# Patient Record
Sex: Female | Born: 1950 | Race: Black or African American | Hispanic: No | Smoking: Never smoker
Health system: Southern US, Community
[De-identification: ages and names within clinical notes are randomized; demographics above are authoritative.]

## PROBLEM LIST (undated history)

## (undated) DIAGNOSIS — Z800 Family history of malignant neoplasm of digestive organs: Secondary | ICD-10-CM

## (undated) DIAGNOSIS — I10 Essential (primary) hypertension: Secondary | ICD-10-CM

## (undated) DIAGNOSIS — T7840XA Allergy, unspecified, initial encounter: Secondary | ICD-10-CM

## (undated) DIAGNOSIS — N189 Chronic kidney disease, unspecified: Secondary | ICD-10-CM

## (undated) DIAGNOSIS — M8580 Other specified disorders of bone density and structure, unspecified site: Secondary | ICD-10-CM

## (undated) DIAGNOSIS — B019 Varicella without complication: Secondary | ICD-10-CM

## (undated) DIAGNOSIS — K279 Peptic ulcer, site unspecified, unspecified as acute or chronic, without hemorrhage or perforation: Secondary | ICD-10-CM

## (undated) DIAGNOSIS — K219 Gastro-esophageal reflux disease without esophagitis: Secondary | ICD-10-CM

## (undated) DIAGNOSIS — M1990 Unspecified osteoarthritis, unspecified site: Secondary | ICD-10-CM

## (undated) DIAGNOSIS — E669 Obesity, unspecified: Secondary | ICD-10-CM

## (undated) DIAGNOSIS — D649 Anemia, unspecified: Secondary | ICD-10-CM

## (undated) DIAGNOSIS — Z87440 Personal history of urinary (tract) infections: Secondary | ICD-10-CM

## (undated) DIAGNOSIS — J45909 Unspecified asthma, uncomplicated: Secondary | ICD-10-CM

## (undated) HISTORY — DX: Other specified disorders of bone density and structure, unspecified site: M85.80

## (undated) HISTORY — DX: Chronic kidney disease, unspecified: N18.9

## (undated) HISTORY — DX: Gastro-esophageal reflux disease without esophagitis: K21.9

## (undated) HISTORY — PX: BREAST BIOPSY: SHX20

## (undated) HISTORY — DX: Unspecified osteoarthritis, unspecified site: M19.90

## (undated) HISTORY — DX: Allergy, unspecified, initial encounter: T78.40XA

## (undated) HISTORY — DX: Essential (primary) hypertension: I10

## (undated) HISTORY — DX: Personal history of urinary (tract) infections: Z87.440

## (undated) HISTORY — DX: Obesity, unspecified: E66.9

## (undated) HISTORY — PX: TONSILLECTOMY: SUR1361

## (undated) HISTORY — DX: Peptic ulcer, site unspecified, unspecified as acute or chronic, without hemorrhage or perforation: K27.9

## (undated) HISTORY — DX: Varicella without complication: B01.9

## (undated) HISTORY — DX: Anemia, unspecified: D64.9

## (undated) HISTORY — DX: Family history of malignant neoplasm of digestive organs: Z80.0

## (undated) HISTORY — DX: Unspecified asthma, uncomplicated: J45.909

---

## 1994-07-18 HISTORY — PX: ABDOMINAL HYSTERECTOMY: SHX81

## 1998-01-12 ENCOUNTER — Ambulatory Visit (HOSPITAL_COMMUNITY): Admission: RE | Admit: 1998-01-12 | Discharge: 1998-01-12 | Payer: Self-pay | Admitting: Family Medicine

## 2000-09-22 ENCOUNTER — Encounter: Payer: Self-pay | Admitting: Family Medicine

## 2000-09-22 ENCOUNTER — Ambulatory Visit (HOSPITAL_COMMUNITY): Admission: RE | Admit: 2000-09-22 | Discharge: 2000-09-22 | Payer: Self-pay | Admitting: Family Medicine

## 2002-07-18 HISTORY — PX: COLONOSCOPY: SHX174

## 2006-12-13 ENCOUNTER — Ambulatory Visit: Payer: Self-pay | Admitting: Obstetrics and Gynecology

## 2012-07-18 HISTORY — PX: BREAST SURGERY: SHX581

## 2012-11-14 ENCOUNTER — Encounter: Payer: Self-pay | Admitting: Vascular Surgery

## 2012-11-14 ENCOUNTER — Other Ambulatory Visit: Payer: Self-pay | Admitting: *Deleted

## 2012-11-14 DIAGNOSIS — I739 Peripheral vascular disease, unspecified: Secondary | ICD-10-CM

## 2012-12-31 ENCOUNTER — Encounter: Payer: Self-pay | Admitting: Vascular Surgery

## 2013-01-01 ENCOUNTER — Encounter (INDEPENDENT_AMBULATORY_CARE_PROVIDER_SITE_OTHER): Payer: 59 | Admitting: *Deleted

## 2013-01-01 ENCOUNTER — Encounter: Payer: Self-pay | Admitting: Vascular Surgery

## 2013-01-01 ENCOUNTER — Ambulatory Visit (INDEPENDENT_AMBULATORY_CARE_PROVIDER_SITE_OTHER): Payer: 59 | Admitting: Vascular Surgery

## 2013-01-01 ENCOUNTER — Other Ambulatory Visit: Payer: Self-pay | Admitting: *Deleted

## 2013-01-01 VITALS — BP 166/94 | HR 75 | Resp 16 | Ht 63.0 in | Wt 240.0 lb

## 2013-01-01 DIAGNOSIS — I739 Peripheral vascular disease, unspecified: Secondary | ICD-10-CM

## 2013-01-01 DIAGNOSIS — L97209 Non-pressure chronic ulcer of unspecified calf with unspecified severity: Secondary | ICD-10-CM

## 2013-01-01 DIAGNOSIS — L819 Disorder of pigmentation, unspecified: Secondary | ICD-10-CM

## 2013-01-01 DIAGNOSIS — I83223 Varicose veins of left lower extremity with both ulcer of ankle and inflammation: Secondary | ICD-10-CM

## 2013-01-01 DIAGNOSIS — M79609 Pain in unspecified limb: Secondary | ICD-10-CM | POA: Insufficient documentation

## 2013-01-01 NOTE — Addendum Note (Signed)
Addended by: Adria Dill L on: 01/01/2013 04:32 PM   Modules accepted: Orders

## 2013-01-01 NOTE — Progress Notes (Signed)
[facility]   Patient name: Sonya Gilbert MRN: [id number] DOB: 1950-10-22 Sex: female   Referred by: Manson Passey  Reason for referral:  Chief Complaint  Patient presents with  . New Evaluation    C/O Left lower lateral leg, dicoloration and spreading, duration 6 mos.  Right medial leg sore, getting worse in the last year.     HISTORY OF PRESENT ILLNESS: Patient has today referred for concern regarding possible arterial insufficiency. She does have discomfort and discoloration in her left leg. 0 the lateral aspect of her ankle and has been progressive. She reports that this is causes itching sensations and also discomfort over this area. She does report some tightening in her calf with walking but this is minimal. 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 She has no history of tissue loss or arterial rest pain. She does have strong family history of extensive venous varicosities in what sounds like severe venous hypertension in her mother with bleeding from the distal varicosities at some point in her life.  Past Medical History  Diagnosis Date  . Asthma   . Arthritis   . Hypertension   . Obesity   . Anemia     Past Surgical History  Procedure Laterality Date  . Abdominal hysterectomy  1996  . Tonsillectomy      At 62 yrs old    History   Social History  . Marital Status: Single    Spouse Name: N/A    Number of Children: N/A  . Years of Education: N/A   Occupational History  . Not on file.   Social History Main Topics  . Smoking status: Never Smoker   . Smokeless tobacco: Never Used  . Alcohol Use: 0.6 oz/week    1 Glasses of wine per week  . Drug Use: No  . Sexually Active: Not on file   Other Topics Concern  . Not on file   Social History Narrative  . No narrative on file    Family History  Problem Relation Age of Onset  . Cancer Sister    breast  . Hypertension Sister   . Deep vein thrombosis Sister   . Heart disease Mother     CHF  . Hypertension Mother   . Deep vein thrombosis Mother   . Cancer Father     Colon  . Hypertension Brother   . Cancer Sister     Colon    Allergies as of 01/01/2013 - Review Complete 01/01/2013  Allergen Reaction Noted  . Codeine Palpitations 11/14/2012  . Erythromycin Hives and Nausea And Vomiting 11/14/2012    Current Outpatient Prescriptions on File Prior to Visit  Medication Sig Dispense Refill  . albuterol (PROVENTIL HFA) 108 (90 BASE) MCG/ACT inhaler Inhale 2 puffs into the lungs every 6 (six) hours as needed for wheezing.      . Fluticasone-Salmeterol (ADVAIR) 250-50 MCG/DOSE AEPB Inhale 1 puff into the lungs every 12 (twelve) hours.      Marland Kitchen olmesartan-hydrochlorothiazide (BENICAR HCT) 20-12.5 MG per tablet Take 1 tablet by mouth daily.      . Vitamin D, Ergocalciferol, (DRISDOL) 50000 UNITS CAPS Take 50,000 Units by mouth 2 (two) times a week.       No current facility-administered medications on file prior to visit.     REVIEW OF SYSTEMS:  Positives indicated with an "X"  CARDIOVASCULAR:  [ ]  chest pain   [ ]   chest pressure   [ ]  palpitations   [ ]  orthopnea   [ ]  dyspnea on exertion   [x ] claudication   [ ]  rest pain   [ ]  DVT   [ ]  phlebitis PULMONARY:   [ ]  productive cough   [ ]  asthma   [ ]  wheezing NEUROLOGIC:   [ ]  weakness  [ ]  paresthesias  [ ]  aphasia  [ ]  amaurosis  [ ]  dizziness HEMATOLOGIC:   [ ]  bleeding problems   [ ]  clotting disorders MUSCULOSKELETAL:  [ ]  joint pain   [ ]  joint swelling GASTROINTESTINAL: [ ]   blood in stool  [ ]   hematemesis GENITOURINARY:  [ ]   dysuria  [ ]   hematuria PSYCHIATRIC:  [ ]  history of major depression INTEGUMENTARY:  [ ]  rashes  [ ]  ulcers CONSTITUTIONAL:  [ ]  fever   [ ]  chills  PHYSICAL EXAMINATION:  General: The patient is a well-nourished female, in no acute distress. Vital signs are BP 166/94  Pulse 75  Resp 16   Ht 5\' 3"  (1.6 m)  Wt 240 lb (108.863 kg)  BMI 42.52 kg/m2  SpO2 99% Pulmonary: There is a good air exchange bilaterally without wheezing or rales. Abdomen: Soft and non-tender with normal pitch bowel sounds. Musculoskeletal: There are no major deformities.  There is no significant extremity pain. Neurologic: No focal weakness or paresthesias are detected, Skin: Does have a thickening and hemosiderin deposit in the lateral aspect of her left distal leg above the level of her lateral malleolus. Psychiatric: The patient has normal affect. Cardiovascular: There is a regular rate and rhythm without significant murmur appreciated. Pulse status: 2+ dorsalis pedis pulses bilaterally   [facility] Vascular Lab Studies:  Ordered and Independently Reviewed arterial duplex reveals normal ankle arm index and normal triphasic waveforms bilaterally.  She did not have a formal duplex of her venous system. I did image these with SonoSite ultrasound and this does show prominent saphenous veins bilaterally. Unable to determine reflux with a screening study Impression and Plan:  No evidence of significant arterial insufficiency causing pain. This does appear to be more related to venous hypertension. I expanded she will need a venous duplex. We will have her return at her convenience in the next several weeks for a venous duplex and office visit to discuss this further. I not able to terminate she is a candidate currently for any specific treatment other than elevation and compression. We'll discuss this further when she has a venous reflux study.    Declin Rajan [facility] [place] Office: [phone]

## 2013-01-14 ENCOUNTER — Encounter: Payer: Self-pay | Admitting: Vascular Surgery

## 2013-01-15 ENCOUNTER — Encounter (INDEPENDENT_AMBULATORY_CARE_PROVIDER_SITE_OTHER): Payer: 59 | Admitting: *Deleted

## 2013-01-15 ENCOUNTER — Encounter: Payer: Self-pay | Admitting: Vascular Surgery

## 2013-01-15 ENCOUNTER — Ambulatory Visit (INDEPENDENT_AMBULATORY_CARE_PROVIDER_SITE_OTHER): Payer: 59 | Admitting: Vascular Surgery

## 2013-01-15 VITALS — BP 147/87 | HR 84 | Resp 18 | Ht 63.0 in | Wt 244.2 lb

## 2013-01-15 DIAGNOSIS — I739 Peripheral vascular disease, unspecified: Secondary | ICD-10-CM

## 2013-01-15 DIAGNOSIS — L819 Disorder of pigmentation, unspecified: Secondary | ICD-10-CM

## 2013-01-15 DIAGNOSIS — M79609 Pain in unspecified limb: Secondary | ICD-10-CM

## 2013-01-15 DIAGNOSIS — I83893 Varicose veins of bilateral lower extremities with other complications: Secondary | ICD-10-CM

## 2013-01-15 DIAGNOSIS — R609 Edema, unspecified: Secondary | ICD-10-CM

## 2013-01-15 NOTE — Progress Notes (Signed)
Patient presents today for continued evaluation of her left leg discomfort. I seen her several weeks ago with normal arterial study showing no significant future problems. She does have hemosiderin deposits had appearance of venous hypertension in the lateral left ankle. She does have discomfort with prolonged standing and more swelling in her left leg in her right leg.   Past Medical History  Diagnosis Date  . Asthma   . Arthritis   . Hypertension   . Obesity   . Anemia     History  Substance Use Topics  . Smoking status: Never Smoker   . Smokeless tobacco: Never Used  . Alcohol Use: 0.6 oz/week    1 Glasses of wine per week    Family History  Problem Relation Age of Onset  . Cancer Sister     breast  . Hypertension Sister   . Deep vein thrombosis Sister   . Heart disease Mother     CHF  . Hypertension Mother   . Deep vein thrombosis Mother   . Cancer Father     Colon  . Hypertension Brother   . Cancer Sister     Colon    Allergies  Allergen Reactions  . Codeine Palpitations  . Erythromycin Hives and Nausea And Vomiting    Current outpatient prescriptions:albuterol (PROVENTIL HFA) 108 (90 BASE) MCG/ACT inhaler, Inhale 2 puffs into the lungs every 6 (six) hours as needed for wheezing., Disp: , Rfl: ;  Fluticasone-Salmeterol (ADVAIR) 250-50 MCG/DOSE AEPB, Inhale 1 puff into the lungs every 12 (twelve) hours., Disp: , Rfl: ;  olmesartan-hydrochlorothiazide (BENICAR HCT) 20-12.5 MG per tablet, Take 1 tablet by mouth daily., Disp: , Rfl:  Vitamin D, Ergocalciferol, (DRISDOL) 50000 UNITS CAPS, Take 50,000 Units by mouth 2 (two) times a week., Disp: , Rfl:   BP 147/87  Pulse 84  Resp 18  Ht 5\' 3"  (1.6 m)  Wt 244 lb 3.2 oz (110.768 kg)  BMI 43.27 kg/m2  Body mass index is 43.27 kg/(m^2).       Physical exam: Well-developed black female no acute distress Palpable dorsalis pedis pulses bilaterally hemosiderin deposits over the lateral aspect of her left above  lateral malleolus ankle  Left leg venous duplex today reveals no evidence of deep venous reflux. She does have reflux in her saphenous vein through the midportion of her thigh with multiple branches outside the fascia emanating from this.  Impression and plan venous hypertension causing hemosiderin deposits at changes of venous stasis disease. The patient was fitted with thigh-high graduated compression garments today. We will see her again in 3 months for continued discussion. She may be a candidate for laser ablation should she have persistent difficulty despite conservative treatment

## 2013-04-29 ENCOUNTER — Encounter: Payer: Self-pay | Admitting: Vascular Surgery

## 2013-04-30 ENCOUNTER — Encounter (INDEPENDENT_AMBULATORY_CARE_PROVIDER_SITE_OTHER): Payer: Self-pay

## 2013-04-30 ENCOUNTER — Encounter: Payer: Self-pay | Admitting: Vascular Surgery

## 2013-04-30 ENCOUNTER — Ambulatory Visit (INDEPENDENT_AMBULATORY_CARE_PROVIDER_SITE_OTHER): Payer: 59 | Admitting: Vascular Surgery

## 2013-04-30 VITALS — BP 129/82 | HR 94 | Resp 18 | Ht 63.0 in | Wt 244.0 lb

## 2013-04-30 DIAGNOSIS — I83893 Varicose veins of bilateral lower extremities with other complications: Secondary | ICD-10-CM | POA: Insufficient documentation

## 2013-04-30 DIAGNOSIS — M79609 Pain in unspecified limb: Secondary | ICD-10-CM

## 2013-04-30 NOTE — Progress Notes (Signed)
Problems with Activities of Daily Living Secondary to Leg Pain  1. Sonya Gilbert states that exercise is very difficult for her due to leg pain.  2. Sonya Gilbert, Sonya Gilbert states that any activities that require prolonged standing (cooking, cleaning, shopping) are difficult for her due to leg pain.     Failure of  Conservative Therapy:  1. Worn 20-30 mm Hg thigh high compression hose >3 months with no relief of symptoms.  2. Frequently elevates legs-no relief of symptoms  3. Taken Ibuprofen 600 Mg TID with no relief of symptoms.  The patient presents today for further discussion of her lower trim of the discomfort. Her main complaint today is of bilateral knee discomfort. She reports this can occur with walking or prolonged standing. Just reports that she is sitting for several hours and then arises she has stiffness and soreness and to she begins to walk while in this becomes better. She does have persistent slight amount of swelling had no relief from the graduated compression garments.  Past Medical History  Diagnosis Date  . Asthma   . Arthritis   . Hypertension   . Obesity   . Anemia     History  Substance Use Topics  . Smoking status: Never Smoker   . Smokeless tobacco: Never Used  . Alcohol Use: 0.6 oz/week    1 Glasses of wine per week    Family History  Problem Relation Age of Onset  . Cancer Sister     breast  . Hypertension Sister   . Deep vein thrombosis Sister   . Heart disease Mother     CHF  . Hypertension Mother   . Deep vein thrombosis Mother   . Cancer Father     Colon  . Hypertension Brother   . Cancer Sister     Colon    Allergies  Allergen Reactions  . Codeine Palpitations  . Erythromycin Hives and Nausea And Vomiting    Current outpatient prescriptions:albuterol (PROVENTIL HFA) 108 (90 BASE) MCG/ACT inhaler, Inhale 2 puffs into the lungs every 6 (six) hours as needed for wheezing., Disp: , Rfl: ;  Fluticasone-Salmeterol (ADVAIR) 250-50 MCG/DOSE  AEPB, Inhale 1 puff into the lungs every 12 (twelve) hours., Disp: , Rfl: ;  olmesartan-hydrochlorothiazide (BENICAR HCT) 20-12.5 MG per tablet, Take 1 tablet by mouth daily., Disp: , Rfl:  Vitamin D, Ergocalciferol, (DRISDOL) 50000 UNITS CAPS, Take 50,000 Units by mouth 2 (two) times a week., Disp: , Rfl:   BP 129/82  Pulse 94  Resp 18  Ht 5\' 3"  (1.6 m)  Wt 244 lb (110.678 kg)  BMI 43.23 kg/m2  Body mass index is 43.23 kg/(m^2).       On physical exam well-developed moderately obese female no acute distress Palpable dorsalis pedis pulses bilaterally She does not have any evidence of varicosities. She does have some lower extremity swelling bilaterally  I reimaged her veins with SonoSite bilaterally. This does show stricture branches off of her saphenous vein with some enlargement of the saphenous vein. This was consistent with her formal left leg duplex in July.  Impression and plan superficial venous reflux. Her main complaints appear to be more arthritic. She is having evaluation of this and I certainly would reserve any recommendation for treatment since it seems that the majority of her symptoms are not related to venous hypertension. I discussed this with the patient and recommended that she see [place] should she have prior persistent trouble with swelling or aching with prolonged standing discrete from  the knee pain bilaterally. She will see [place] on an as-needed basis

## 2016-12-30 ENCOUNTER — Ambulatory Visit (INDEPENDENT_AMBULATORY_CARE_PROVIDER_SITE_OTHER): Payer: BLUE CROSS/BLUE SHIELD | Admitting: Family Medicine

## 2016-12-30 ENCOUNTER — Encounter: Payer: Self-pay | Admitting: Family Medicine

## 2016-12-30 VITALS — BP 130/80 | HR 90 | Resp 12 | Ht 63.0 in | Wt 265.4 lb

## 2016-12-30 DIAGNOSIS — E785 Hyperlipidemia, unspecified: Secondary | ICD-10-CM | POA: Insufficient documentation

## 2016-12-30 DIAGNOSIS — I83893 Varicose veins of bilateral lower extremities with other complications: Secondary | ICD-10-CM

## 2016-12-30 DIAGNOSIS — Z6841 Body Mass Index (BMI) 40.0 and over, adult: Secondary | ICD-10-CM | POA: Diagnosis not present

## 2016-12-30 DIAGNOSIS — I10 Essential (primary) hypertension: Secondary | ICD-10-CM | POA: Diagnosis not present

## 2016-12-30 DIAGNOSIS — E559 Vitamin D deficiency, unspecified: Secondary | ICD-10-CM | POA: Diagnosis not present

## 2016-12-30 MED ORDER — OLMESARTAN MEDOXOMIL-HCTZ 20-12.5 MG PO TABS: 1.0000 | ORAL_TABLET | Freq: Every day | ORAL | 2 refills | Status: DC

## 2016-12-30 NOTE — Patient Instructions (Signed)
A few things to remember from today's visit:   Hyperlipidemia, unspecified hyperlipidemia type - Plan: Lipid panel  Hypertension, essential, benign - Plan: Basic metabolic panel, olmesartan-hydrochlorothiazide (BENICAR HCT) 20-12.5 MG tablet  Vitamin D deficiency, unspecified - Plan: VITAMIN D 25 Hydroxy (Vit-D Deficiency, Fractures)  Blood pressure goal for most people is less than 140/90. Some populations (older than 60) the goal is less than 150/90.  Most recent cardiologists' recommendations recommend blood pressure at or less than 130/80.   Elevated blood pressure increases the risk of strokes, heart and kidney disease, and eye problems. Regular physical activity and a healthy diet (DASH diet) usually help. Low salt diet. Take medications as instructed.  Caution with some over the counter medications as cold medications, dietary products (for weight loss), and Ibuprofen or Aleve (frequent use);all these medications could cause elevation of blood pressure.    Please be sure medication list is accurate. If a new problem present, please set up appointment sooner than planned today.

## 2016-12-30 NOTE — Progress Notes (Signed)
HPI:   [name] is a 66 y.o. female, who is here today to establish care.  Former PCP: Sonya Gilbert Last preventive routine visit: 2017  Chronic medical problems: Asthma (exacerbations with URI),OA,allergies, HTN,and recurrent UTI's among some.   Hypertension:   Dx in 2004. Currently on Benicar HCT 20-12.5 mg daily.  Home BP readings: 130's/70-80's. She is not taking medications as instructed, forgets to take med some days. She has not noted side effects.  She has not noted unusual headache, visual changes, exertional chest pain, dyspnea,  focal weakness, or edema.   HLD, states that over 6 months ago her "bad cholesterol" was closed to 200. She has not been consistent with a healthy low fat diet. She does not exercise regularly.   LE edema/varicose vein disease, she is not longer following with Dr Sonya CookeyEarly, vascular. She does not wear compression stocking regularly. Edema worse at the end of the day and with hot weather. She does not have ulcers but has pigmentation changes. She denies claudication like symptoms.  Vit D deficiency: She took course of Ergocalciferol 50,000 U weekly, not longer on supplementation.  She does not exercise regularly and has not been consider with a healthy diet.    Review of Systems  Constitutional: Negative for activity change, appetite change, fatigue, fever and unexpected weight change.  HENT: Negative for mouth sores, nosebleeds and trouble swallowing.   Eyes: Negative for redness and visual disturbance.  Respiratory: Negative for cough, shortness of breath and wheezing.   Cardiovascular: Positive for leg swelling. Negative for chest pain and palpitations.  Gastrointestinal: Negative for abdominal pain, nausea and vomiting.       Negative for changes in bowel habits.  Endocrine: Negative for cold intolerance and heat intolerance.  Genitourinary: Negative for decreased urine volume, difficulty urinating, dysuria and  hematuria.  Musculoskeletal: Negative for gait problem and myalgias.  Skin: Negative for rash and wound.  Allergic/Immunologic: Positive for environmental allergies.  Neurological: Negative for syncope, weakness, numbness and headaches.  Psychiatric/Behavioral: Negative for confusion and sleep disturbance.    No current outpatient prescriptions on file prior to visit.   No current facility-administered medications on file prior to visit.      Past Medical History:  Diagnosis Date  . Anemia   . Arthritis   . Asthma   . Chicken pox   . Chronic kidney disease   . History of frequent urinary tract infections   . Hypertension   . Obesity    Allergies  Allergen Reactions  . Codeine Palpitations  . Erythromycin Hives and Nausea And Vomiting    Family History  Problem Relation Age of Onset  . Cancer Sister        breast  . Hypertension Sister   . Deep vein thrombosis Sister   . Heart disease Mother        CHF  . Hypertension Mother   . Deep vein thrombosis Mother   . Cancer Father        Colon  . Cancer Sister        Colon  . Hypertension Brother     Social History   Social History  . Marital status: Widowed    Spouse name: N/A  . Number of children: N/A  . Years of education: N/A   Social History Main Topics  . Smoking status: Never Smoker  . Smokeless tobacco: Never Used  . Alcohol use 0.6 oz/week    1 Glasses of wine  per week  . Drug use: No  . Sexual activity: Not Asked   Other Topics Concern  . None   Social History Narrative  . None    Vitals:   12/30/16 0932  BP: 130/80  Pulse: 90  Resp: 12   O2 sat at RA 98% Body mass index is 47.01 kg/m.   Physical Exam  Nursing note and vitals reviewed. Constitutional: She is oriented to person, place, and time. She appears well-developed. No distress.  HENT:  Head: Atraumatic.  Mouth/Throat: Oropharynx is clear and moist and mucous membranes are normal.  Eyes: Conjunctivae and EOM are normal.  Pupils are equal, round, and reactive to light.  Neck: No tracheal deviation present. No thyroid mass and no thyromegaly present.  Cardiovascular: Normal rate and regular rhythm.   No murmur heard. Pulses:      Dorsalis pedis pulses are 2+ on the right side, and 2+ on the left side.  Varicose veins LE,bilateral.No ulcers, + hyperpigmentation changes.  Respiratory: Effort normal and breath sounds normal. No respiratory distress.  GI: Soft. She exhibits no mass. There is no hepatomegaly. There is no tenderness.  Musculoskeletal: She exhibits edema (Trace pitting edema LE,bilateral.). She exhibits no tenderness.  Lymphadenopathy:    She has no cervical adenopathy.  Neurological: She is alert and oriented to person, place, and time. She has normal strength. Gait normal.  Skin: Skin is warm. No erythema.  Psychiatric: She has a normal mood and affect.  Well groomed, good eye contact.    ASSESSMENT AND PLAN:   Sonya Gilbert was seen today for establish care.  Diagnoses and all orders for this visit:  Varicose veins of both legs with edema  Educated about Dx, treatment options, and complications. Skin care. Strongly recommend wearing compression stocking .  Hyperlipidemia, unspecified hyperlipidemia type  She is not fasting today, future labs placed. Low fat diet recommended for now. Further recommendations will be given according to lab results.  -     Lipid panel; Future  Hypertension, essential, benign  Adequately controlled. No changes in current management. DASH diet recommended. Eye exam recommended annually. F/U in 6 months, before if needed.  -     Basic metabolic panel; Future -     olmesartan-hydrochlorothiazide (BENICAR HCT) 20-12.5 MG tablet; Take 1 tablet by mouth daily.  Vitamin D deficiency, unspecified  Further recommendations will be given according to lab results. For now OTC Vit D maintenance 1000 U daily.  -     VITAMIN D 25 Hydroxy (Vit-D Deficiency,  Fractures); Future  Class 3 severe obesity due to excess calories with serious comorbidity and body mass index (BMI) of 45.0 to 49.9 in adult [facility])  We discussed benefits of wt loss as well as adverse effects of obesity. Consistency with healthy diet and physical activity recommended. Daily brisk walking for 15-30 min as tolerated.    Etola Mull G. [place], MD  [facility] [place] office.

## 2017-01-03 ENCOUNTER — Other Ambulatory Visit (INDEPENDENT_AMBULATORY_CARE_PROVIDER_SITE_OTHER): Payer: BLUE CROSS/BLUE SHIELD

## 2017-01-03 DIAGNOSIS — E785 Hyperlipidemia, unspecified: Secondary | ICD-10-CM | POA: Diagnosis not present

## 2017-01-03 DIAGNOSIS — I10 Essential (primary) hypertension: Secondary | ICD-10-CM

## 2017-01-03 DIAGNOSIS — E559 Vitamin D deficiency, unspecified: Secondary | ICD-10-CM | POA: Diagnosis not present

## 2017-01-03 LAB — LIPID PANEL
Cholesterol: 195 mg/dL (ref 0–200)
HDL: 72.9 mg/dL (ref >39.00)
LDL Cholesterol: 110 mg/dL — ABNORMAL HIGH (ref 0–99)
NONHDL: 122.48
Total CHOL/HDL Ratio: 3
Triglycerides: 60 mg/dL (ref 0.0–149.0)
VLDL: 12 mg/dL (ref 0.0–40.0)

## 2017-01-03 LAB — BASIC METABOLIC PANEL
BUN: 14 mg/dL (ref 6–23)
CALCIUM: 8.8 mg/dL (ref 8.4–10.5)
CHLORIDE: 107 meq/L (ref 96–112)
CO2: 26 meq/L (ref 19–32)
CREATININE: 0.76 mg/dL (ref 0.40–1.20)
GFR: 98.6 mL/min (ref >60.00)
GLUCOSE: 97 mg/dL (ref 70–99)
Potassium: 3.9 mEq/L (ref 3.5–5.1)
Sodium: 142 mEq/L (ref 135–145)

## 2017-01-03 LAB — VITAMIN D 25 HYDROXY (VIT D DEFICIENCY, FRACTURES): VITD: 23.18 ng/mL — ABNORMAL LOW (ref 30.00–100.00)

## 2017-04-09 NOTE — Progress Notes (Signed)
HPI:   Sonya Gilbert is a 66 y.o. female, who is here today c/o LLE pain.  She was seen on 12/30/16  She has seen vascular in the past for left LE pain, 2014 (Sonya Gilbert). Compression stocking were recommended at that time. She is not wearing compression stocking.  She states that this pain she is reporting today she has ahd for about a week. It is different to the one she has had for years and more intense.  LLE pain is described as achy, 4/10 now but was 7/10. Pain is that is friable left eye and extends to the ankle. She denies recent trauma or unusual activity that could have caused pain.  Pain is aggravated by standing up and walking, alleviated by rest.  She states that she noted worsening edema when pain started involving left thigh,knee,and calf. Edema and pain have improved.  She has not tried OTC medications, she applied local heat.  She denies trouble starting to prolonged sitting (driving or flying) , hormonal therapy, or recent surgery. She states that her job requires to be seated for long periods of time.  She denies associated chest pain, dyspnea, palpitations, extremity cold sensation, leg numbness, or cyanosis.  Hx of knee OA, she has ahd knee injections but did not help. Pain is exacerbated by going up and down stairs.  She also has history of lower back pain, it is not radiated, exacerbated by prolonged standing while washing dishes or mopping. Occasional toes numbness, chronic,and stable.   Obesity: Dietary changes since her last OV: Not consistently. Exercise: Not consistently.   Review of Systems  Constitutional: Positive for activity change. Negative for appetite change, fatigue and fever.  HENT: Negative for mouth sores, nosebleeds and trouble swallowing.   Eyes: Negative for redness and visual disturbance.  Respiratory: Negative for cough, shortness of breath and wheezing.   Cardiovascular: Positive for leg swelling. Negative for  chest pain and palpitations.  Gastrointestinal: Negative for abdominal pain, nausea and vomiting.       Negative for changes in bowel habits.  Genitourinary: Negative for decreased urine volume, difficulty urinating, dysuria and hematuria.  Musculoskeletal: Positive for arthralgias, back pain, gait problem and myalgias.  Skin: Negative for pallor and rash.  Neurological: Negative for syncope, weakness and headaches.      Current Outpatient Prescriptions on File Prior to Visit  Medication Sig Dispense Refill  . olmesartan-hydrochlorothiazide (BENICAR HCT) 20-12.5 MG tablet Take 1 tablet by mouth daily. 90 tablet 2   No current facility-administered medications on file prior to visit.      Past Medical History:  Diagnosis Date  . Anemia   . Arthritis   . Asthma   . Chicken pox   . Chronic kidney disease   . History of frequent urinary tract infections   . Hypertension   . Obesity    Allergies  Allergen Reactions  . Codeine Palpitations  . Erythromycin Hives and Nausea And Vomiting    Social History   Social History  . Marital status: Widowed    Spouse name: N/A  . Number of children: N/A  . Years of education: N/A   Social History Main Topics  . Smoking status: Never Smoker  . Smokeless tobacco: Never Used  . Alcohol use 0.6 oz/week    1 Glasses of wine per week  . Drug use: No  . Sexual activity: Not Asked   Other Topics Concern  . None   Social History Narrative  .  None    Vitals:   04/10/17 0810  BP: 126/70  Pulse: 81  Resp: 12  SpO2: 98%   Body mass index is 47.65 kg/m.  Wt Readings from Last 3 Encounters:  04/10/17 269 lb (122 kg)  12/30/16 265 lb 6 oz (120.4 kg)  04/30/13 244 lb (110.7 kg)    Physical Exam  Nursing note and vitals reviewed. Constitutional: She is oriented to person, place, and time. She appears well-developed. She does not appear ill. No distress.  HENT:  Head: Normocephalic and atraumatic.  Eyes: Conjunctivae are  normal.  Cardiovascular: Normal rate and regular rhythm.   No murmur heard. Pulses:      Dorsalis pedis pulses are 2+ on the right side, and 2+ on the left side.  Calf tenderness: Negative Holman's sign. On inspection left calf seems mildly bigger in diameter than right.  Varicose veins LE bilateral.  Respiratory: Effort normal and breath sounds normal. No respiratory distress.  GI: Soft. She exhibits no mass. There is no tenderness.  Musculoskeletal: She exhibits edema (1+ pitting LE bilateral).  Knee: on inspection no effusion, erythema, or deformities.  Left knee with limitation of ROM and pain elicited. Tenderness upon palpation of popliteal fossa, no masses appreciated. Right knee ROM with no significant limitation.  Neurological: She is alert and oriented to person, place, and time. She has normal strength. Coordination normal.  Stable gait, antalgic.  Skin: Skin is warm. No rash noted. No erythema.  Psychiatric: She has a normal mood and affect.  Well groomed, good eye contact.     ASSESSMENT AND PLAN:  Sonya Gilbert was seen today for left leg pain.  Diagnoses and all orders for this visit:  Left leg pain  Improved. We discussed possible etiologies, vein disease,radicular, and osteoarthritis among some.  Because she is reporting worsening of left lower extremity edema, involving the whole extremity, I think it is appropriate to evaluate for DVT. Tramadol 50 mg 3 times per day as needed for 5 days recommended, we discussed side effects. Fall precautions also discussed.  We could arrange LE venous ultrasound for today but she has an appointment for tomorrow. She was clearly instructed about warning signs and she voices understanding.  -     VAS [place] LOWER EXTREMITY VENOUS (DVT); Future -     traMADol (ULTRAM) 50 MG tablet; Take 1 tablet (50 mg total) by mouth every 8 (eight) hours as needed.  Osteoarthritis of left knee, unspecified osteoarthritis type  Recommend  avoiding activities that can aggravate problem. She has received intra-articular steroid injections in the past, she is reporting that they didn't help. She prefers to hold on orthopedist referral for now, she will let me know. Because no recent history of trauma of in the imaging is necessary at this time. Topical Diclofenac may help with pain. I also gave her a prescription for Tramadol to use for a few days as needed.  -     diclofenac sodium (VOLTAREN) 1 % GEL; Apply 4 g topically 4 (four) times daily.  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 Strongly recommend trying compression stockings. Lower extremity elevation may also help as well as wt loss.  Class 3 severe obesity due to excess calories with serious comorbidity and body mass index (BMI) of 45.0 to 49.9 in adult [facility])  She gained about 4 pounds since her last visit in June 2018. We discussed benefits of wt loss as well as  adverse effects of obesity. Consistency with healthy diet and physical activity recommended,low impact exercise.    Betty G. [place], MD  [facility]. [place] office.

## 2017-04-10 ENCOUNTER — Encounter: Payer: Self-pay | Admitting: Family Medicine

## 2017-04-10 ENCOUNTER — Ambulatory Visit (INDEPENDENT_AMBULATORY_CARE_PROVIDER_SITE_OTHER): Payer: BLUE CROSS/BLUE SHIELD | Admitting: Family Medicine

## 2017-04-10 VITALS — BP 126/70 | HR 81 | Resp 12 | Ht 63.0 in | Wt 269.0 lb

## 2017-04-10 DIAGNOSIS — M170 Bilateral primary osteoarthritis of knee: Secondary | ICD-10-CM | POA: Insufficient documentation

## 2017-04-10 DIAGNOSIS — E66813 Obesity, class 3: Secondary | ICD-10-CM

## 2017-04-10 DIAGNOSIS — M1712 Unilateral primary osteoarthritis, left knee: Secondary | ICD-10-CM

## 2017-04-10 DIAGNOSIS — I83892 Varicose veins of left lower extremities with other complications: Secondary | ICD-10-CM | POA: Diagnosis not present

## 2017-04-10 DIAGNOSIS — Z6841 Body Mass Index (BMI) 40.0 and over, adult: Secondary | ICD-10-CM | POA: Diagnosis not present

## 2017-04-10 DIAGNOSIS — M79605 Pain in left leg: Secondary | ICD-10-CM | POA: Diagnosis not present

## 2017-04-10 MED ORDER — DICLOFENAC SODIUM 1 % TD GEL: 4.0000 g | Freq: Four times a day (QID) | TRANSDERMAL | 3 refills | Status: DC

## 2017-04-10 MED ORDER — TRAMADOL HCL 50 MG PO TABS: 50.0000 mg | ORAL_TABLET | Freq: Three times a day (TID) | ORAL | 0 refills | Status: AC | PRN

## 2017-04-10 NOTE — Patient Instructions (Signed)
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  The following may help:  Over the counter topical medications: Icy Hot or Asper cream with Lidocaine. Tai Chi or PT. Fall prevention. Avoid weight gain. Fish oil, over the counter Megared for example, 2 capsules daily. Avoid kneeling, going up and down stairs, squatting, or activities that came aggravate the pain.   Limited no it is decided to go to orthopedist appt.  Weight loss would help to slow down progression.  Leg ultrasound was arranged for tomorrow. Swelling is worse, chest pain, palpitation, or difficulty breathing please seek immediate medical attention.  Please be sure medication list is accurate. If a new problem present, please set up appointment sooner than planned today.

## 2017-04-11 ENCOUNTER — Ambulatory Visit (HOSPITAL_COMMUNITY)
Admission: RE | Admit: 2017-04-11 | Discharge: 2017-04-11 | Disposition: A | Discharge status: Home / Self Care | Payer: BLUE CROSS/BLUE SHIELD | Source: Ambulatory Visit | Attending: Cardiovascular Disease | Admitting: Cardiovascular Disease

## 2017-04-11 DIAGNOSIS — M79605 Pain in left leg: Secondary | ICD-10-CM

## 2017-04-12 ENCOUNTER — Telehealth: Payer: Self-pay

## 2017-04-12 NOTE — Telephone Encounter (Signed)
I left a voicemail for patient letting her know that the ultrasound was negative and to call back with any questions.

## 2017-04-12 NOTE — Telephone Encounter (Signed)
-----   Message from Betty G [place], MD sent at 04/11/2017  5:06 PM EDT ----- Regarding: FW: Lower Extremity Venous Duplex Please convey this information to Ms Fleishman. Leg [place] negative for DVT.  Thanks, BJ ----- Message ----- From: Smiley Houseman, RVT Sent: 04/11/2017   9:57 AM To: Betty G [place], MD Subject: Lower Extremity Venous Duplex                  Today's left lower extremity venous duplex is negative for DVT.

## 2017-04-18 ENCOUNTER — Telehealth: Payer: Self-pay | Admitting: Family Medicine

## 2017-04-18 DIAGNOSIS — M1712 Unilateral primary osteoarthritis, left knee: Secondary | ICD-10-CM

## 2017-04-18 NOTE — Telephone Encounter (Signed)
Pt is calling stating that the medication that was given to her on last week is not working it eased the pain a little bit and she is scared when going down the steps feeling that her knee may give away on her and she would like to go to the orthopedics for as discussed on 04/10/17.

## 2017-04-18 NOTE — Telephone Encounter (Signed)
The referral has been entered. 

## 2017-04-24 ENCOUNTER — Encounter (INDEPENDENT_AMBULATORY_CARE_PROVIDER_SITE_OTHER): Payer: Self-pay | Admitting: Orthopaedic Surgery

## 2017-04-24 ENCOUNTER — Ambulatory Visit (INDEPENDENT_AMBULATORY_CARE_PROVIDER_SITE_OTHER): Payer: BLUE CROSS/BLUE SHIELD | Admitting: Orthopaedic Surgery

## 2017-04-24 ENCOUNTER — Ambulatory Visit (INDEPENDENT_AMBULATORY_CARE_PROVIDER_SITE_OTHER): Payer: BLUE CROSS/BLUE SHIELD

## 2017-04-24 DIAGNOSIS — M1712 Unilateral primary osteoarthritis, left knee: Secondary | ICD-10-CM

## 2017-04-24 MED ORDER — PREDNISONE 10 MG (21) PO TBPK: ORAL_TABLET | ORAL | 0 refills | Status: DC

## 2017-04-24 NOTE — Progress Notes (Signed)
Office Visit Note   Patient: Sonya Gilbert           Date of Birth: August 30, 1950           MRN: [id number] Visit Date: 04/24/2017              Requested by: [place], Betty G, MD [address] [place], [place] [address] PCP: [place], Betty G, MD   Assessment & Plan: Visit Diagnoses:  1. Primary osteoarthritis of left knee     Plan: Overall impression is left knee osteoarthritis flareup. Patient declined cortisone injection. We offered her oral prednisone which she agreed to. Continue taking oral NSAIDs and using topical Voltaren as needed. Stressed the importance of weight loss and physical therapy. If she changes her mind she is welcome to come back for a cortisone injection.  Follow-Up Instructions: Return if symptoms worsen or fail to improve.   Orders:  Orders Placed This Encounter  Procedures  . XR KNEE 3 VIEW LEFT   Meds ordered this encounter  Medications  . predniSONE (STERAPRED UNI-PAK 21 TAB) 10 MG (21) TBPK tablet    Sig: Take as directed    Dispense:  21 tablet    Refill:  0      Procedures: No procedures performed   Clinical Data: No additional findings.   Subjective: Chief Complaint  Patient presents with  . Left Knee - Pain    Patient is a 66 year old female comes in with 3 week history of left knee pain. Her PCP ruled out a Baker cyst. She has been using a heating pad and Voltaren gel with partial relief. The pain radiates from the knee down into the ankle. The pain is worse with cold weather and with activity. She has significant difficulty getting up from a chair. Denies any numbness and tingling or back pain    Review of Systems  Constitutional: Negative.   HENT: Negative.   Eyes: Negative.   Respiratory: Negative.   Cardiovascular: Negative.   Endocrine: Negative.   Musculoskeletal: Negative.   Neurological: Negative.   Hematological: Negative.   Psychiatric/Behavioral: Negative.   All other systems reviewed and are  negative.    Objective: Vital Signs: There were no vitals taken for this visit.  Physical Exam  Constitutional: She is oriented to person, place, and time. She appears well-developed and well-nourished.  HENT:  Head: Normocephalic and atraumatic.  Eyes: EOM are normal.  Neck: Neck supple.  Pulmonary/Chest: Effort normal.  Abdominal: Soft.  Neurological: She is alert and oriented to person, place, and time.  Skin: Skin is warm. Capillary refill takes less than 2 seconds.  Psychiatric: She has a normal mood and affect. Her behavior is normal. Judgment and thought content normal.  Nursing note and vitals reviewed.   Ortho Exam Left knee exam shows no joint effusion collaterals and cruciates are stable. Mild joint line tenderness. Specialty Comments:  No specialty comments available.  Imaging: Xr Knee 3 View Left  Result Date: 04/24/2017 Joint space narrowing consistent with osteoarthritis    PMFS History: Patient Active Problem List   Diagnosis Date Noted  . Osteoarthritis of left knee 04/10/2017  . Hyperlipidemia 12/30/2016  . Hypertension, essential, benign 12/30/2016  . Vitamin D deficiency, unspecified 12/30/2016  . Class 3 severe obesity due to excess calories with serious comorbidity and body mass index (BMI) of 45.0 to 49.9 in adult (HCC) 12/30/2016  . Varicose veins of both legs with edema 04/30/2013  . Edema 01/15/2013  . Peripheral  vascular disease, unspecified (HCC) 01/01/2013  . Discoloration of skin of lower leg-Left Lateral  01/01/2013  . Pain in limb-Right medial leg 01/01/2013   Past Medical History:  Diagnosis Date  . Anemia   . Arthritis   . Asthma   . Chicken pox   . Chronic kidney disease   . History of frequent urinary tract infections   . Hypertension   . Obesity     Family History  Problem Relation Age of Onset  . Cancer Sister        breast  . Hypertension Sister   . Deep vein thrombosis Sister   . Heart disease Mother        CHF   . Hypertension Mother   . Deep vein thrombosis Mother   . Cancer Father        Colon  . Cancer Sister        Colon  . Hypertension Brother     Past Surgical History:  Procedure Laterality Date  . ABDOMINAL HYSTERECTOMY  1996  . BREAST SURGERY  2014   biopsy  . TONSILLECTOMY     At 66 yrs old   Social History   Occupational History  . Not on file.   Social History Main Topics  . Smoking status: Never Smoker  . Smokeless tobacco: Never Used  . Alcohol use 0.6 oz/week    1 Glasses of wine per week  . Drug use: No  . Sexual activity: Not on file

## 2017-05-25 ENCOUNTER — Ambulatory Visit (INDEPENDENT_AMBULATORY_CARE_PROVIDER_SITE_OTHER): Payer: BLUE CROSS/BLUE SHIELD | Admitting: Orthopedic Surgery

## 2017-05-25 ENCOUNTER — Encounter (INDEPENDENT_AMBULATORY_CARE_PROVIDER_SITE_OTHER): Payer: Self-pay | Admitting: Orthopedic Surgery

## 2017-05-25 ENCOUNTER — Ambulatory Visit (INDEPENDENT_AMBULATORY_CARE_PROVIDER_SITE_OTHER): Payer: BLUE CROSS/BLUE SHIELD

## 2017-05-25 DIAGNOSIS — M25562 Pain in left knee: Secondary | ICD-10-CM | POA: Diagnosis not present

## 2017-05-25 NOTE — Progress Notes (Signed)
Office Visit Note   Patient: Sonya Gilbert           Date of Birth: 10/06/1950           MRN: [id number] Visit Date: 05/25/2017 Requested by: [place]il[place], Betty G, MD [address] [place], [place] [id number] PCP: [place], Betty G, MD  Subjective: Chief Complaint  Patient presents with  . Left Knee - Pain    HPI: Sonya Gilbert is a patient with left knee pain.  She was seen last week and advised that she had a component of arthritis; however, today this morning she was walking down the stairs and her knee gave way and she twisted the knee and she's been unable to weight-bear since that time.  She did take prednisone and a topical anti-inflammatory but this injury has exacerbated her knee pain significantly.  Last injection in the knee was 2 and half years ago.              ROS: All systems reviewed are negative as they relate to the chief complaint within the history of present illness.  Patient denies  fevers or chills.   Assessment & Plan: Visit Diagnoses:  1. Left knee pain, unspecified chronicity     Plan: Impression is acute knee pain with likely exacerbation of arthritis from twisting injury.  She may have a degenerative meniscal tear which is preventing weight-bearing.  Minimal to no effusion today.  Injection performed and she is going to call us on Monday to let us know how she is doing.  Anti-inflammatories make her tired so we don't really want to go anything stronger than over-the-counter anti-inflammatories for now.  Hinge knee brace also provided.  She will call Monday and we can potentially obtain further imaging if necessary.  Follow-Up Instructions: Return if symptoms worsen or fail to improve.   Orders:  Orders Placed This Encounter  Procedures  . XR KNEE 3 VIEW LEFT   No orders of the defined types were placed in this encounter.     Procedures: No procedures performed   Clinical Data: No additional findings.  Objective: Vital Signs: There were no vitals  taken for this visit.  Physical Exam:   Constitutional: Patient appears well-developed HEENT:  Head: Normocephalic Eyes:EOM are normal Neck: Normal range of motion Cardiovascular: Normal rate Pulmonary/chest: Effort normal Neurologic: Patient is alert Skin: Skin is warm Psychiatric: Patient has normal mood and affect    Ortho Exam: Orthopedic exam demonstrates some pain with full extension.  Pedal pulses palpable.  Collaterals are stable.  Anterior cruciate ligament feels like it's intact.  Extensor mechanism is intact.  No groin pain with internal/external rotation of the left leg no effusion in the knee  Specialty Comments:  No specialty comments available.  Imaging: Xr Knee 3 View Left  Result Date: 05/25/2017 AP lateral merchant left knee reviewed.  Chronic Pellegrini-Stieda lesion is present which was present on prior radiographs.  Mild joint space narrowing is present on the medial side.  No acute fracture or effusion.    PMFS History: Patient Active Problem List   Diagnosis Date Noted  . Osteoarthritis of left knee 04/10/2017  . Hyperlipidemia 12/30/2016  . Hypertension, essential, benign 12/30/2016  . Vitamin D deficiency, unspecified 12/30/2016  . Class 3 severe obesity due to excess calories with serious comorbidity and body mass index (BMI) of 45.0 to 49.9 in adult (HCC) 12/30/2016  . Varicose veins of both legs with edema 04/30/2013  . Edema 01/15/2013  .  Peripheral vascular disease, unspecified (HCC) 01/01/2013  . Discoloration of skin of lower leg-Left Lateral  01/01/2013  . Pain in limb-Right medial leg 01/01/2013   Past Medical History:  Diagnosis Date  . Anemia   . Arthritis   . Asthma   . Chicken pox   . Chronic kidney disease   . History of frequent urinary tract infections   . Hypertension   . Obesity     Family History  Problem Relation Age of Onset  . Cancer Sister        breast  . Hypertension Sister   . Deep vein thrombosis Sister     . Heart disease Mother        CHF  . Hypertension Mother   . Deep vein thrombosis Mother   . Cancer Father        Colon  . Cancer Sister        Colon  . Hypertension Brother     Past Surgical History:  Procedure Laterality Date  . ABDOMINAL HYSTERECTOMY  1996  . BREAST SURGERY  2014   biopsy  . TONSILLECTOMY     At 66 yrs old   Social History   Occupational History  . Not on file  Tobacco Use  . Smoking status: Never Smoker  . Smokeless tobacco: Never Used  Substance and Sexual Activity  . Alcohol use: Yes    Alcohol/week: 0.6 oz    Types: 1 Glasses of wine per week  . Drug use: No  . Sexual activity: Not on file

## 2017-05-30 ENCOUNTER — Ambulatory Visit (INDEPENDENT_AMBULATORY_CARE_PROVIDER_SITE_OTHER): Payer: BLUE CROSS/BLUE SHIELD | Admitting: Family Medicine

## 2017-05-30 ENCOUNTER — Encounter: Payer: Self-pay | Admitting: Family Medicine

## 2017-05-30 VITALS — BP 126/78 | HR 97 | Temp 98.1°F | Resp 12 | Ht 63.0 in | Wt 268.2 lb

## 2017-05-30 DIAGNOSIS — Z9189 Other specified personal risk factors, not elsewhere classified: Secondary | ICD-10-CM

## 2017-05-30 DIAGNOSIS — Z0000 Encounter for general adult medical examination without abnormal findings: Secondary | ICD-10-CM | POA: Diagnosis not present

## 2017-05-30 DIAGNOSIS — Z1159 Encounter for screening for other viral diseases: Secondary | ICD-10-CM

## 2017-05-30 DIAGNOSIS — Z1211 Encounter for screening for malignant neoplasm of colon: Secondary | ICD-10-CM | POA: Diagnosis not present

## 2017-05-30 DIAGNOSIS — E559 Vitamin D deficiency, unspecified: Secondary | ICD-10-CM

## 2017-05-30 DIAGNOSIS — Z1239 Encounter for other screening for malignant neoplasm of breast: Secondary | ICD-10-CM

## 2017-05-30 DIAGNOSIS — Z1231 Encounter for screening mammogram for malignant neoplasm of breast: Secondary | ICD-10-CM | POA: Diagnosis not present

## 2017-05-30 DIAGNOSIS — M8580 Other specified disorders of bone density and structure, unspecified site: Secondary | ICD-10-CM | POA: Diagnosis not present

## 2017-05-30 DIAGNOSIS — Z131 Encounter for screening for diabetes mellitus: Secondary | ICD-10-CM | POA: Diagnosis not present

## 2017-05-30 LAB — BASIC METABOLIC PANEL
BUN: 13 mg/dL (ref 6–23)
CALCIUM: 9.4 mg/dL (ref 8.4–10.5)
CHLORIDE: 104 meq/L (ref 96–112)
CO2: 28 meq/L (ref 19–32)
CREATININE: 0.78 mg/dL (ref 0.40–1.20)
GFR: 95.57 mL/min (ref >60.00)
Glucose, Bld: 91 mg/dL (ref 70–99)
Potassium: 4 mEq/L (ref 3.5–5.1)
Sodium: 140 mEq/L (ref 135–145)

## 2017-05-30 LAB — VITAMIN D 25 HYDROXY (VIT D DEFICIENCY, FRACTURES): VITD: 28.41 ng/mL — ABNORMAL LOW (ref 30.00–100.00)

## 2017-05-30 LAB — HEMOGLOBIN A1C: HEMOGLOBIN A1C: 5.4 % (ref 4.6–6.5)

## 2017-05-30 MED ORDER — ZOSTER VAC RECOMB ADJUVANTED 50 MCG/0.5ML IM SUSR: INTRAMUSCULAR | 1 refills | Status: DC

## 2017-05-30 NOTE — Patient Instructions (Signed)
A few things to remember from today's visit:   Vitamin D deficiency, unspecified - Plan: VITAMIN D 25 Hydroxy (Vit-D Deficiency, Fractures)  Diabetes mellitus screening - Plan: Hemoglobin A1c, Basic metabolic panel  Encounter for HCV screening test for high risk patient - Plan: Hepatitis C antibody screen  Breast cancer screening - Plan: Mammogram Digital Screening  Osteopenia, unspecified location - Plan: DEXAScan  Colon cancer screening - Plan: Ambulatory referral to Gastroenterology  Routine general medical examination at a health care facility  Today you have you routine preventive visit.  At least 150 minutes of moderate exercise per week, daily brisk walking for 15-30 min is a good exercise option. Healthy diet low in saturated (animal) fats and sweets and consisting of fresh fruits and vegetables, lean meats such as fish and white chicken and whole grains.  These are some of recommendations for screening depending of age and risk factors:   - Vaccines:  Tdap vaccine every 10 years.  Shingles vaccine recommended at age 64, could be given after 64 years of age but not sure about insurance coverage.   Pneumonia vaccines:  Prevnar 13 at 65 and Pneumovax at 66. Sometimes Pneumovax is giving earlier if history of smoking, lung disease,diabetes,kidney disease among some.    Screening for diabetes at age 64 and every 3 years.   -Breast cancer: Mammogram: There is disagreement between experts about when to start screening in low risk asymptomatic female but recent recommendations are to start screening at 6840 and not later than 64 years old , every 1-2 years and after 64 yo q 2 years. Screening is recommended until 64 years old but some women can continue screening depending of healthy issues.   Colon cancer screening: starts at 64 years old until 64 years old.  Cholesterol disorder screening at age 64 and every 3 years.  Also recommended:  1. Dental visit- Brush and floss  your teeth twice daily; visit your dentist twice a year. 2. Eye doctor- Get an eye exam at least every 2 years. 3. Helmet use- Always wear a helmet when riding a bicycle, motorcycle, rollerblading or skateboarding. 4. Safe sex- If you may be exposed to sexually transmitted infections, use a condom. 5. Seat belts- Seat belts can save your live; always wear one. 6. Smoke/Carbon Monoxide detectors- These detectors need to be installed on the appropriate level of your home. Replace batteries at least once a year. 7. Skin cancer- When out in the sun please cover up and use sunscreen 15 SPF or higher. 8. Violence- If anyone is threatening or hurting you, please tell your healthcare provider.  9. Drink alcohol in moderation- Limit alcohol intake to one drink or less per day. Never drink and drive.  Please be sure medication list is accurate. If a new problem present, please set up appointment sooner than planned today.

## 2017-05-30 NOTE — Progress Notes (Signed)
HPI:   [name] is a 66 y.o. female, who is here today for her routine physical.  Last CPE: 09/2015.  Regular exercise 3 or more time per week: She is walking a few times per week, she also has a Systems analystpersonal trainer.  Injured her left knee again, so she had to rest knee for a few days.  Following a healthy diet: Started doing so a month ago.  She lives with her son.  Chronic medical problems: PVD,OA,varicose vein disease,vit D def, HTN,and obesity among some.  She follows with Dr Rhett BannisterXu,ortho.   Pap smear S/P hysterectomy due to fibroids and endometriosis with DUB. Hx of abnormal pap smears: Denies Hx of STD's: Denies  Mammogram: 3 years ago. Colonoscopy: 02/2011 and 5 years follow up was recommended. DEXA: 01/2010, osteopenia. She takes Vit D supplementation, not consistent with Ca++ intake.  Hep C screening: Has not had it and would like to do it today. Last eye exam 06/2016.  She has no concerns today.   Review of Systems  Constitutional: Negative for appetite change, fatigue and fever.  HENT: Negative for dental problem, hearing loss, mouth sores, trouble swallowing and voice change.   Eyes: Negative for redness and visual disturbance.  Respiratory: Negative for cough, shortness of breath and wheezing.   Cardiovascular: Negative for chest pain, palpitations and leg swelling.  Gastrointestinal: Negative for abdominal pain, blood in stool, nausea and vomiting.       No changes in bowel habits.  Endocrine: Negative for cold intolerance, heat intolerance, polydipsia, polyphagia and polyuria.  Genitourinary: Negative for decreased urine volume, dysuria, hematuria, vaginal bleeding and vaginal discharge.  Musculoskeletal: Positive for arthralgias. Negative for back pain, gait problem and neck pain.  Skin: Negative for color change and rash.  Allergic/Immunologic: Positive for environmental allergies.  Neurological: Negative for syncope, weakness and headaches.    Hematological: Negative for adenopathy. Does not bruise/bleed easily.  Psychiatric/Behavioral: Negative for confusion and sleep disturbance. The patient is not nervous/anxious.   All other systems reviewed and are negative.     Current Outpatient Medications on File Prior to Visit  Medication Sig Dispense Refill  . diclofenac sodium (VOLTAREN) 1 % GEL Apply 4 g topically 4 (four) times daily. 4 Tube 3  . olmesartan-hydrochlorothiazide (BENICAR HCT) 20-12.5 MG tablet Take 1 tablet by mouth daily. 90 tablet 2   No current facility-administered medications on file prior to visit.      Past Medical History:  Diagnosis Date  . Anemia   . Arthritis   . Asthma   . Chicken pox   . Chronic kidney disease   . History of frequent urinary tract infections   . Hypertension   . Obesity     Past Surgical History:  Procedure Laterality Date  . ABDOMINAL HYSTERECTOMY  1996  . BREAST SURGERY  2014   biopsy  . TONSILLECTOMY     At 66 yrs old    Allergies  Allergen Reactions  . Codeine Palpitations  . Erythromycin Hives and Nausea And Vomiting    Family History  Problem Relation Age of Onset  . Cancer Sister        breast  . Hypertension Sister   . Deep vein thrombosis Sister   . Heart disease Mother        CHF  . Hypertension Mother   . Deep vein thrombosis Mother   . Cancer Father        Colon  . Cancer Sister  Colon  . Hypertension Brother     Social History   Socioeconomic History  . Marital status: Widowed    Spouse name: None  . Number of children: None  . Years of education: None  . Highest education level: None  Social Needs  . Financial resource strain: None  . Food insecurity - worry: None  . Food insecurity - inability: None  . Transportation needs - medical: None  . Transportation needs - non-medical: None  Occupational History  . None  Tobacco Use  . Smoking status: Never Smoker  . Smokeless tobacco: Never Used  Substance and Sexual  Activity  . Alcohol use: Yes    Alcohol/week: 0.6 oz    Types: 1 Glasses of wine per week  . Drug use: No  . Sexual activity: None  Other Topics Concern  . None  Social History Narrative  . None     Vitals:   05/30/17 0802  BP: 126/78  Pulse: 97  Resp: 12  Temp: 98.1 F (36.7 C)  SpO2: 97%   Body mass index is 47.52 kg/m.   Wt Readings from Last 3 Encounters:  05/30/17 268 lb 4 oz (121.7 kg)  04/10/17 269 lb (122 kg)  12/30/16 265 lb 6 oz (120.4 kg)    Physical Exam  Nursing note and vitals reviewed. Constitutional: She is oriented to person, place, and time. She appears well-developed. No distress.  HENT:  Head: Normocephalic and atraumatic.  Right Ear: Hearing, tympanic membrane, external ear and ear canal normal.  Left Ear: Hearing, tympanic membrane, external ear and ear canal normal.  Mouth/Throat: Uvula is midline, oropharynx is clear and moist and mucous membranes are normal.  Eyes: Conjunctivae and EOM are normal. Pupils are equal, round, and reactive to light.  Neck: No tracheal deviation present. No thyromegaly present.  Cardiovascular: Normal rate and regular rhythm.  No murmur heard. Pulses:      Dorsalis pedis pulses are 2+ on the right side, and 2+ on the left side.  Respiratory: Effort normal and breath sounds normal. No respiratory distress.  GI: Soft. She exhibits no mass. There is no hepatomegaly. There is no tenderness.  Genitourinary: No breast swelling or tenderness.  Genitourinary Comments: Breast: No masses,skin changes,or nipple discharge bilateral.  Musculoskeletal: She exhibits edema (1+ pitting LE edema,bilateral.). She exhibits no tenderness.  No major deformity or signs of synovitis appreciated. Left knee decreased ROM, flexion elicits pain. Antalgic gait.  Lymphadenopathy:    She has no cervical adenopathy.       Right: No supraclavicular adenopathy present.       Left: No supraclavicular adenopathy present.  Neurological: She is  alert and oriented to person, place, and time. She has normal strength. No cranial nerve deficit. Coordination and gait normal.  Reflex Scores:      Bicep reflexes are 2+ on the right side and 2+ on the left side.      Patellar reflexes are 2+ on the right side and 2+ on the left side. Skin: Skin is warm. No rash noted. No erythema.  Psychiatric: She has a normal mood and affect. Her speech is normal.  Well groomed, good eye contact.    ASSESSMENT AND PLAN:   Sonya Gilbert Gilbert was seen today for annual exam.  Diagnoses and all orders for this visit:  Lab Results  Component Value Date   CREATININE 0.78 05/30/2017   BUN 13 05/30/2017   NA 140 05/30/2017   K 4.0 05/30/2017   CL 104  05/30/2017   CO2 28 05/30/2017   Lab Results  Component Value Date   HGBA1C 5.4 05/30/2017    Routine general medical examination at a health care facility  We discussed the importance of regular physical activity and healthy diet for prevention of chronic illness and/or complications. Preventive guidelines reviewed. Vaccination up to date.  Ca++ and vit D supplementation recommended. Next CPE in a year.   The 10-year ASCVD risk score Denman George(Goff [place] Montez HagemanJr., et al., 2013) is: 7.5%   Values used to calculate the score:     Age: [age] years     Sex: Female     Is Non-Hispanic African American: Yes     Diabetic: No     Tobacco smoker: No     Systolic Blood Pressure: 126 mmHg     Is BP treated: Yes     HDL Cholesterol: 72.9 mg/dL     Total Cholesterol: 195 mg/dL  Vitamin D deficiency, unspecified  Not sure about how many U of vit D she is taking. No changes in current management, will follow labs done today and will give further recommendations accordingly.  -     VITAMIN D 25 Hydroxy (Vit-D Deficiency, Fractures)  Diabetes mellitus screening -     Hemoglobin A1c -     Basic metabolic panel  Encounter for HCV screening test for high risk patient -     Hepatitis C antibody screen  Breast cancer  screening -     Mammogram Digital Screening; Future  Osteopenia, unspecified location -     DEXAScan; Future  Colon cancer screening -     Ambulatory referral to Gastroenterology  Other orders -     Zoster Vaccine Adjuvanted [facility](SHINGRIX) injection; 0.5 ml in muscle and repeat in 8 weeks   Return in 6 months (on 11/27/2017) for HTN,wt.    Betty G. Swazil[place]Jordan, MD  [facility]. [place] office.

## 2017-05-31 LAB — HEPATITIS C ANTIBODY
HEP C AB: NONREACTIVE
SIGNAL TO CUT-OFF: 0.02 (ref <1.00)

## 2017-06-02 ENCOUNTER — Encounter: Payer: Self-pay | Admitting: Internal Medicine

## 2017-06-04 ENCOUNTER — Encounter: Payer: Self-pay | Admitting: Family Medicine

## 2017-06-16 ENCOUNTER — Ambulatory Visit (AMBULATORY_SURGERY_CENTER): Payer: Self-pay | Admitting: *Deleted

## 2017-06-16 ENCOUNTER — Other Ambulatory Visit: Payer: Self-pay

## 2017-06-16 VITALS — Ht 63.0 in | Wt 264.0 lb

## 2017-06-16 DIAGNOSIS — Z800 Family history of malignant neoplasm of digestive organs: Secondary | ICD-10-CM

## 2017-06-16 MED ORDER — NA SULFATE-K SULFATE-MG SULF 17.5-3.13-1.6 GM/177ML PO SOLN: 1.0000 | Freq: Once | ORAL | 0 refills | Status: AC

## 2017-06-16 NOTE — Progress Notes (Signed)
No egg or soy allergy known to patient  No issues with past sedation with any surgeries  or procedures, no intubation problems  No diet pills per patient No home 02 use per patient  No blood thinners per patient  Pt denies issues with constipation  No A fib or A flutter  EMMI video sent to pt's e mail pt declined   

## 2017-06-20 ENCOUNTER — Telehealth (INDEPENDENT_AMBULATORY_CARE_PROVIDER_SITE_OTHER): Payer: Self-pay | Admitting: *Deleted

## 2017-06-20 DIAGNOSIS — M25562 Pain in left knee: Principal | ICD-10-CM

## 2017-06-20 DIAGNOSIS — G8929 Other chronic pain: Secondary | ICD-10-CM

## 2017-06-20 NOTE — Telephone Encounter (Signed)
[facility] and they will call pt to schedule appt

## 2017-06-20 NOTE — Telephone Encounter (Signed)
Pt called stating she was seen couple weeks ago for left knee pain and states she is still having pain in her knee and would like to proceed with MRI of her left knee. I see notes that we can potentially obtain further imaging if necessary.   Ok to order MRI of Left knee? Please advise.  Thanks

## 2017-06-20 NOTE — Telephone Encounter (Signed)
Ok to schedule, I entered order for you.

## 2017-06-26 ENCOUNTER — Encounter: Payer: BLUE CROSS/BLUE SHIELD | Admitting: Internal Medicine

## 2017-06-28 ENCOUNTER — Ambulatory Visit
Admission: RE | Admit: 2017-06-28 | Discharge: 2017-06-28 | Disposition: A | Discharge status: Home / Self Care | Payer: BLUE CROSS/BLUE SHIELD | Source: Ambulatory Visit | Attending: Orthopedic Surgery | Admitting: Orthopedic Surgery

## 2017-06-28 DIAGNOSIS — G8929 Other chronic pain: Secondary | ICD-10-CM

## 2017-06-28 DIAGNOSIS — M25562 Pain in left knee: Principal | ICD-10-CM

## 2017-07-05 ENCOUNTER — Ambulatory Visit (INDEPENDENT_AMBULATORY_CARE_PROVIDER_SITE_OTHER): Payer: BLUE CROSS/BLUE SHIELD | Admitting: Orthopedic Surgery

## 2017-07-05 ENCOUNTER — Encounter (INDEPENDENT_AMBULATORY_CARE_PROVIDER_SITE_OTHER): Payer: Self-pay | Admitting: Orthopedic Surgery

## 2017-07-05 DIAGNOSIS — M1712 Unilateral primary osteoarthritis, left knee: Secondary | ICD-10-CM | POA: Diagnosis not present

## 2017-07-08 ENCOUNTER — Encounter (INDEPENDENT_AMBULATORY_CARE_PROVIDER_SITE_OTHER): Payer: Self-pay | Admitting: Orthopedic Surgery

## 2017-07-08 NOTE — Progress Notes (Signed)
Office Visit Note   Patient: Sonya Gilbert           Date of Birth: 09/06/1950           MRN: [id number] Visit Date: 07/05/2017 Requested by: SwazilandJordan, Betty G, MD [address] [place], [place] [id number] PCP: SwazilandJordan, Betty G, MD  Subjective: Chief Complaint  Patient presents with  . Left Knee - Follow-up    HPI: Sonya Gilbert presents for review of her MRI scan of the left knee.  Good relief for 2 weeks.  States her knee is better at this time and she can live with her symptoms.  MRI scan shows mild arthritis along with a radial meniscal root tear 9 mm of displacement.  Occult he with walking but no real mechanical symptoms              ROS: All systems reviewed are negative as they relate to the chief complaint within the history of present illness.  Patient denies  fevers or chills.   Assessment & Plan: Visit Diagnoses:  1. Unilateral primary osteoarthritis, left knee     Plan: Impression is left knee pain with meniscal root tear mild arthritis in the medial compartment.  Patient is 66 years old.  She has increased body mass index.  The amount of displacement of that meniscal root tear makes it highly unlikely to be able to be repaired.  I think she will have to wait this 1 out until the arthritis and pain become severe enough that she considers partial knee replacement.  We will preapproved for for gel injection in the meantime.  Follow-up next year when her symptoms worsen.  I will see her back as needed  Follow-Up Instructions: Return if symptoms worsen or fail to improve.   Orders:  No orders of the defined types were placed in this encounter.  No orders of the defined types were placed in this encounter.     Procedures: No procedures performed   Clinical Data: No additional findings.  Objective: Vital Signs: There were no vitals taken for this visit.  Physical Exam:   Constitutional: Patient appears well-developed HEENT:  Head: Normocephalic Eyes:EOM are  normal Neck: Normal range of motion Cardiovascular: Normal rate Pulmonary/chest: Effort normal Neurologic: Patient is alert Skin: Skin is warm Psychiatric: Patient has normal mood and affect    Ortho Exam: Orthopedic exam demonstrates normal gait and alignment with palpable pedal pulses no nerve root tension signs intact extensor mechanism no other masses lymphadenopathy or skin changes noted in the left knee region.  Medial joint line tenderness is present.  Collateral and cruciate ligaments are stable.  Specialty Comments:  No specialty comments available.  Imaging: No results found.   PMFS History: Patient Active Problem List   Diagnosis Date Noted  . Osteoarthritis of left knee 04/10/2017  . Hyperlipidemia 12/30/2016  . Hypertension, essential, benign 12/30/2016  . Vitamin D deficiency, unspecified 12/30/2016  . Class 3 severe obesity due to excess calories with serious comorbidity and body mass index (BMI) of 45.0 to 49.9 in adult (HCC) 12/30/2016  . Varicose veins of both legs with edema 04/30/2013  . Edema 01/15/2013  . Peripheral vascular disease, unspecified (HCC) 01/01/2013  . Discoloration of skin of lower leg-Left Lateral  01/01/2013  . Pain in limb-Right medial leg 01/01/2013   Past Medical History:  Diagnosis Date  . Allergy    seasonal  . Anemia    past hx   . Arthritis   .  Asthma   . Chicken pox   . Chronic kidney disease   . Family history of colon cancer in father    age 64 and sister age 64   . GERD (gastroesophageal reflux disease)   . History of frequent urinary tract infections   . Hypertension   . Obesity   . Osteopenia   . Peptic ulcer    history of PUD    Family History  Problem Relation Age of Onset  . Cancer Sister        breast  . Hypertension Sister   . Deep vein thrombosis Sister   . Breast cancer Sister   . Heart disease Mother        CHF  . Hypertension Mother   . Deep vein thrombosis Mother   . Cancer Father         Colon  . Colon cancer Father [id number]  . Cancer Sister        Colon  . Colon cancer Sister [phone]  . Hypertension Brother   . Colon polyps Brother   . Esophageal cancer Neg Hx   . Rectal cancer Neg Hx   . Stomach cancer Neg Hx     Past Surgical History:  Procedure Laterality Date  . ABDOMINAL HYSTERECTOMY  1996  . BREAST SURGERY  2014   biopsy  . COLONOSCOPY  2004   [facility]- normal   . TONSILLECTOMY     At 66 yrs old   Social History   Occupational History  . Not on file  Tobacco Use  . Smoking status: Never Smoker  . Smokeless tobacco: Never Used  Substance and Sexual Activity  . Alcohol use: No    Alcohol/week: 0.6 oz    Types: 1 Glasses of wine per week    Frequency: Never    Comment: 1 glass wine Q 3-4 months   . Drug use: No  . Sexual activity: Not on file

## 2017-07-25 ENCOUNTER — Telehealth (INDEPENDENT_AMBULATORY_CARE_PROVIDER_SITE_OTHER): Payer: Self-pay

## 2017-07-25 NOTE — Telephone Encounter (Signed)
Tried calling patient about left knee monovisc gel injection, no answer LM for her advising to call if wished to proceed. Patient insurance will cover at 90% and she will be responsible for remaining 10% with no copay.

## 2017-08-02 ENCOUNTER — Ambulatory Visit (AMBULATORY_SURGERY_CENTER): Payer: BLUE CROSS/BLUE SHIELD | Admitting: Internal Medicine

## 2017-08-02 ENCOUNTER — Encounter: Payer: Self-pay | Admitting: Internal Medicine

## 2017-08-02 ENCOUNTER — Other Ambulatory Visit: Payer: Self-pay

## 2017-08-02 VITALS — BP 118/75 | HR 76 | Temp 97.5°F | Resp 16 | Ht 63.0 in | Wt 264.0 lb

## 2017-08-02 DIAGNOSIS — Z1212 Encounter for screening for malignant neoplasm of rectum: Secondary | ICD-10-CM

## 2017-08-02 DIAGNOSIS — Z800 Family history of malignant neoplasm of digestive organs: Secondary | ICD-10-CM | POA: Diagnosis not present

## 2017-08-02 DIAGNOSIS — Z1211 Encounter for screening for malignant neoplasm of colon: Secondary | ICD-10-CM | POA: Diagnosis not present

## 2017-08-02 MED ORDER — SODIUM CHLORIDE 0.9 % IV SOLN: 500.0000 mL | Freq: Once | INTRAVENOUS | Status: DC

## 2017-08-02 NOTE — Patient Instructions (Signed)
YOU HAD AN ENDOSCOPIC PROCEDURE TODAY AT THE [facility]:   Refer to the procedure report that was given to you for any specific questions about what was found during the examination.  If the procedure report does not answer your questions, please call your gastroenterologist to clarify.  If you requested that your care partner not be given the details of your procedure findings, then the procedure report has been included in a sealed envelope for you to review at your convenience later.  YOU SHOULD EXPECT: Some feelings of bloating in the abdomen. Passage of more gas than usual.  Walking can help get rid of the air that was put into your GI tract during the procedure and reduce the bloating. If you had a lower endoscopy (such as a colonoscopy or flexible sigmoidoscopy) you may notice spotting of blood in your stool or on the toilet paper. If you underwent a bowel prep for your procedure, you may not have a normal bowel movement for a few days.  Please Note:  You might notice some irritation and congestion in your nose or some drainage.  This is from the oxygen used during your procedure.  There is no need for concern and it should clear up in a day or so.  SYMPTOMS TO REPORT IMMEDIATELY:   Following lower endoscopy (colonoscopy or flexible sigmoidoscopy):  Excessive amounts of blood in the stool  Significant tenderness or worsening of abdominal pains  Swelling of the abdomen that is new, acute  Fever of 100F or higher  For urgent or emergent issues, a gastroenterologist can be reached at any hour by calling [phone].   DIET:  We do recommend a small meal at first, but then you may proceed to your regular diet.  Drink plenty of fluids but you should avoid alcoholic beverages for 24 hours.  ACTIVITY:  You should plan to take it easy for the rest of today and you should NOT DRIVE or use heavy machinery until tomorrow (because of the sedation medicines used during the test).     FOLLOW UP: Our staff will call the number listed on your records the next business day following your procedure to check on you and address any questions or concerns that you may have regarding the information given to you following your procedure. If we do not reach you, we will leave a message.  However, if you are feeling well and you are not experiencing any problems, there is no need to return our call.  We will assume that you have returned to your regular daily activities without incident.  If any biopsies were taken you will be contacted by phone or by letter within the next 1-3 weeks.  Please call us at [phone] if you have not heard about the biopsies in 3 weeks.    SIGNATURES/CONFIDENTIALITY: You and/or your care partner have signed paperwork which will be entered into your electronic medical record.  These signatures attest to the fact that that the information above on your After Visit Summary has been reviewed and is understood.  Full responsibility of the confidentiality of this discharge information lies with you and/or your care-partner.  Diverticulosis information given. Recall 5 years 2024

## 2017-08-02 NOTE — Op Note (Signed)
[facility] Patient Name: Sonya Gilbert Procedure Date: 08/02/2017 10:44 AM MRN: [id number] Endoscopist: Beverley Fiedler , MD Age: 67 Referring MD:  Date of Birth: 12-Aug-1950 Gender: Female Account #: [id number] Procedure:                Colonoscopy Indications:              Colon cancer screening in patient at increased                            risk: Colorectal cancer in father, Colon cancer                            screening in patient at increased risk: Colorectal                            cancer in sister; last colon about 15 years ago Medicines:                Monitored Anesthesia Care Procedure:                Pre-Anesthesia Assessment:                           - Prior to the procedure, a History and Physical                            was performed, and patient medications and                            allergies were reviewed. The patient's tolerance of                            previous anesthesia was also reviewed. The risks                            and benefits of the procedure and the sedation                            options and risks were discussed with the patient.                            All questions were answered, and informed consent                            was obtained. Prior Anticoagulants: The patient has                            taken no previous anticoagulant or antiplatelet                            agents. ASA Grade Assessment: III - A patient with                            severe systemic disease. After reviewing the risks  and benefits, the patient was deemed in                            satisfactory condition to undergo the procedure.                           After obtaining informed consent, the colonoscope                            was passed under direct vision. Throughout the                            procedure, the patient's blood pressure, pulse, and                            oxygen  saturations were monitored continuously. The                            Colonoscope was introduced through the anus and                            advanced to the the cecum, identified by                            appendiceal orifice and ileocecal valve. The                            colonoscopy was performed without difficulty. The                            patient tolerated the procedure well. The quality                            of the bowel preparation was good. The ileocecal                            valve, appendiceal orifice, and rectum were                            photographed. Scope In: 10:55:53 AM Scope Out: 11:09:54 AM Scope Withdrawal Time: 0 hours 9 minutes 42 seconds  Total Procedure Duration: 0 hours 14 minutes 1 second  Findings:                 The digital rectal exam was normal.                           Multiple small and large-mouthed diverticula were                            found in the sigmoid colon, descending colon and                            transverse colon.  The exam was otherwise without abnormality on                            direct and retroflexion views. Complications:            No immediate complications. Estimated Blood Loss:     Estimated blood loss: none. Impression:               - Moderate diverticulosis in the sigmoid colon, in                            the descending colon and in the transverse colon.                           - The examination was otherwise normal on direct                            and retroflexion views.                           - No specimens collected. Recommendation:           - Patient has a contact number available for                            emergencies. The signs and symptoms of potential                            delayed complications were discussed with the                            patient. Return to normal activities tomorrow.                            Written  discharge instructions were provided to the                            patient.                           - Resume previous diet.                           - Continue present medications.                           - Repeat colonoscopy in 5 years for screening                            purposes. Beverley Fiedler, MD 08/02/2017 11:13:02 AM This report has been signed electronically.

## 2017-08-02 NOTE — Progress Notes (Signed)
Pt's states no medical or surgical changes since previsit or office visit. 

## 2017-08-02 NOTE — Progress Notes (Signed)
A and O x3. Report to RN. Tolerated MAC anesthesia well.

## 2017-08-03 ENCOUNTER — Telehealth: Payer: Self-pay | Admitting: *Deleted

## 2017-08-03 NOTE — Telephone Encounter (Signed)
  Follow up Call-  Call back number 08/02/2017  Post procedure Call Back phone  # [phone]  Permission to leave phone message Yes  Some recent data might be hidden     Patient questions:  Message left to call us if necessary.

## 2017-08-03 NOTE — Telephone Encounter (Signed)
  Follow up Call-  Call back number 08/02/2017  Post procedure Call Back phone  # [phone]  Permission to leave phone message Yes  Some recent data might be hidden     Patient questions:  Message left to call us if necessary. Call #2.

## 2017-08-10 ENCOUNTER — Ambulatory Visit (INDEPENDENT_AMBULATORY_CARE_PROVIDER_SITE_OTHER): Payer: BLUE CROSS/BLUE SHIELD | Admitting: Orthopedic Surgery

## 2017-08-10 ENCOUNTER — Encounter (INDEPENDENT_AMBULATORY_CARE_PROVIDER_SITE_OTHER): Payer: Self-pay | Admitting: Orthopedic Surgery

## 2017-08-10 DIAGNOSIS — M1712 Unilateral primary osteoarthritis, left knee: Secondary | ICD-10-CM

## 2017-08-11 ENCOUNTER — Encounter (INDEPENDENT_AMBULATORY_CARE_PROVIDER_SITE_OTHER): Payer: Self-pay | Admitting: Orthopedic Surgery

## 2017-08-11 DIAGNOSIS — M1712 Unilateral primary osteoarthritis, left knee: Secondary | ICD-10-CM | POA: Diagnosis not present

## 2017-08-11 MED ORDER — HYALURONAN 88 MG/4ML IX SOSY
88.0000 mg | PREFILLED_SYRINGE | INTRA_ARTICULAR | Status: AC | PRN
  Administered 2017-08-11: 88 mg via INTRA_ARTICULAR

## 2017-08-11 MED ORDER — LIDOCAINE HCL 1 % IJ SOLN
5.0000 mL | INTRAMUSCULAR | Status: AC | PRN
  Administered 2017-08-11: 5 mL

## 2017-08-11 NOTE — Progress Notes (Signed)
   Procedure Note  Patient: Sonya SporeCarlene P Gilbert             Date of Birth: 05/27/1951           MRN: [id number]             Visit Date: 08/10/2017  Procedures: Visit Diagnoses: Unilateral primary osteoarthritis, left knee  Large Joint Inj: L knee on 08/11/2017 10:49 PM Indications: diagnostic evaluation, joint swelling and pain Details: 18 G 1.5 in needle, superolateral approach  Arthrogram: No  Medications: 5 mL lidocaine 1 %; 88 mg Hyaluronan 88 MG/4ML Outcome: tolerated well, no immediate complications Procedure, treatment alternatives, risks and benefits explained, specific risks discussed. Consent was given by the patient. Immediately prior to procedure a time out was called to verify the correct patient, procedure, equipment, support staff and site/side marked as required. Patient was prepped and draped in the usual sterile fashion.

## 2017-11-21 ENCOUNTER — Telehealth: Payer: Self-pay | Admitting: Internal Medicine

## 2017-11-21 NOTE — Telephone Encounter (Signed)
email sent to Sonya Gilbert per patient regarding codes from procedure on 08/15/17.

## 2017-11-21 NOTE — Telephone Encounter (Signed)
Per Ina Kick..."I tried calling patient but no answer. Please call patient and let her know it was definitely billed as a screening. But it was billed as high risk because of the family history. Let patient know I will resubmit for her but she may have to argue with [employer]. I will do everything I can on this end to help her. Tell her to give it a couple of weeks to see what [employer] does. It was definitely a screening not diagnostic."  Patient was called and notified.

## 2017-12-14 ENCOUNTER — Ambulatory Visit (INDEPENDENT_AMBULATORY_CARE_PROVIDER_SITE_OTHER)
Admission: RE | Admit: 2017-12-14 | Discharge: 2017-12-14 | Disposition: A | Discharge status: Home / Self Care | Payer: BLUE CROSS/BLUE SHIELD | Source: Ambulatory Visit | Attending: Family Medicine | Admitting: Family Medicine

## 2017-12-14 ENCOUNTER — Ambulatory Visit (INDEPENDENT_AMBULATORY_CARE_PROVIDER_SITE_OTHER): Payer: BLUE CROSS/BLUE SHIELD | Admitting: Family Medicine

## 2017-12-14 ENCOUNTER — Encounter: Payer: Self-pay | Admitting: Family Medicine

## 2017-12-14 VITALS — BP 104/70 | HR 102 | Temp 98.0°F | Resp 16 | Ht 63.0 in | Wt 269.8 lb

## 2017-12-14 DIAGNOSIS — J45901 Unspecified asthma with (acute) exacerbation: Secondary | ICD-10-CM

## 2017-12-14 DIAGNOSIS — R0609 Other forms of dyspnea: Secondary | ICD-10-CM

## 2017-12-14 DIAGNOSIS — J069 Acute upper respiratory infection, unspecified: Secondary | ICD-10-CM

## 2017-12-14 LAB — CBC WITH DIFFERENTIAL/PLATELET
BASOS ABS: 0.1 10*3/uL (ref 0.0–0.1)
Basophils Relative: 0.7 % (ref 0.0–3.0)
Eosinophils Absolute: 0.4 10*3/uL (ref 0.0–0.7)
Eosinophils Relative: 5.1 % — ABNORMAL HIGH (ref 0.0–5.0)
HCT: 40.9 % (ref 36.0–46.0)
Hemoglobin: 13.4 g/dL (ref 12.0–15.0)
LYMPHS ABS: 2.5 10*3/uL (ref 0.7–4.0)
Lymphocytes Relative: 32.7 % (ref 12.0–46.0)
MCHC: 32.9 g/dL (ref 30.0–36.0)
MCV: 85.2 fl (ref 78.0–100.0)
MONO ABS: 0.6 10*3/uL (ref 0.1–1.0)
MONOS PCT: 7.6 % (ref 3.0–12.0)
NEUTROS PCT: 53.9 % (ref 43.0–77.0)
Neutro Abs: 4.1 10*3/uL (ref 1.4–7.7)
Platelets: 298 10*3/uL (ref 150.0–400.0)
RBC: 4.8 Mil/uL (ref 3.87–5.11)
RDW: 13.6 % (ref 11.5–15.5)
WBC: 7.5 10*3/uL (ref 4.0–10.5)

## 2017-12-14 MED ORDER — PREDNISONE 10 MG PO TABS: 40.0000 mg | ORAL_TABLET | Freq: Every day | ORAL | 0 refills | Status: AC

## 2017-12-14 NOTE — Progress Notes (Signed)
ACUTE VISIT  HPI:  Chief Complaint  Patient presents with  . Cough    [name] is a 67 y.o.female here today complaining of a wek days of respiratory symptoms. Chest congestion and productive cough with yellowish thick sputum,deneis hemoptysis.  2 days of fever when symptoms started.  Symptoms started with "scratchy" throat,headache,and fatigue.These symptoms have resolved.  She took "emergency cold" medication.   Cough  This is a new problem. The current episode started in the past 7 days. The problem has been gradually improving. The cough is productive of sputum. Associated symptoms include nasal congestion, postnasal drip, rhinorrhea, shortness of breath and wheezing. Pertinent negatives include no chest pain, ear congestion, ear pain, eye redness, heartburn, hemoptysis, myalgias, rash, sweats or weight loss. The symptoms are aggravated by exercise. She has tried a beta-agonist inhaler for the symptoms. The treatment provided mild relief. Her past medical history is significant for asthma and environmental allergies.    Nasal congestion, rhinorrhea, sore throat, and post nasal drainage.   No Hx of recent travel. + Sick contact. No known insect bite.  Hx of allergies: Yes.Hx of asthma. She has had "little" SOB and wheezing. She used Albuterol inh last week.  Symptoms otherwise stable.     Review of Systems  Constitutional: Positive for activity change and fatigue. Negative for appetite change and weight loss.  HENT: Positive for congestion, postnasal drip and rhinorrhea. Negative for ear pain, mouth sores, sinus pressure, trouble swallowing and voice change.   Eyes: Negative for discharge and redness.  Respiratory: Positive for cough, shortness of breath and wheezing. Negative for hemoptysis.   Cardiovascular: Negative for chest pain, palpitations and leg swelling.  Gastrointestinal: Negative for abdominal pain, diarrhea, heartburn, nausea and  vomiting.  Musculoskeletal: Negative for myalgias and neck pain.  Skin: Negative for rash.  Allergic/Immunologic: Positive for environmental allergies.  Neurological: Negative for syncope and weakness.  Hematological: Negative for adenopathy. Does not bruise/bleed easily.  Psychiatric/Behavioral: Positive for sleep disturbance. Negative for confusion.      Current Outpatient Medications on File Prior to Visit  Medication Sig Dispense Refill  . albuterol (PROVENTIL HFA;VENTOLIN HFA) 108 (90 Base) MCG/ACT inhaler Inhale into the lungs every 6 (six) hours as needed for wheezing or shortness of breath.    . Cholecalciferol (HM VITAMIN D3) 4000 units CAPS Take 4,000 Units by mouth daily.    Marland Kitchen loratadine (CLARITIN) 10 MG tablet Take 10 mg by mouth daily.    Marland Kitchen olmesartan-hydrochlorothiazide (BENICAR HCT) 20-12.5 MG tablet Take 1 tablet by mouth daily. 90 tablet 2  . Zoster Vaccine Adjuvanted [facility]) injection 0.5 ml in muscle and repeat in 8 weeks (Patient not taking: Reported on 12/14/2017) 0.5 mL 1   Current Facility-Administered Medications on File Prior to Visit  Medication Dose Route Frequency Provider Last Rate Last Dose  . 0.9 %  sodium chloride infusion  500 mL Intravenous Once Pyrtle, Carie Caddy, MD         Past Medical History:  Diagnosis Date  . Allergy    seasonal  . Anemia    past hx   . Arthritis   . Asthma   . Chicken pox   . Chronic kidney disease   . Family history of colon cancer in father    age 92 and sister age 65   . GERD (gastroesophageal reflux disease)   . History of frequent urinary tract infections   . Hypertension   . Obesity   .  Osteopenia   . Peptic ulcer    history of PUD   Allergies  Allergen Reactions  . Codeine Palpitations  . Erythromycin Hives and Nausea And Vomiting  . Other     Bees, mold, grass, cats     Social History   Socioeconomic History  . Marital status: Widowed    Spouse name: Not on file  . Number of children: Not on file   . Years of education: Not on file  . Highest education level: Not on file  Occupational History  . Not on file  Social Needs  . Financial resource strain: Not on file  . Food insecurity:    Worry: Not on file    Inability: Not on file  . Transportation needs:    Medical: Not on file    Non-medical: Not on file  Tobacco Use  . Smoking status: Never Smoker  . Smokeless tobacco: Never Used  Substance and Sexual Activity  . Alcohol use: No    Alcohol/week: 0.6 oz    Types: 1 Glasses of wine per week    Frequency: Never    Comment: 1 glass wine Q 3-4 months   . Drug use: No  . Sexual activity: Not on file  Lifestyle  . Physical activity:    Days per week: Not on file    Minutes per session: Not on file  . Stress: Not on file  Relationships  . Social connections:    Talks on phone: Not on file    Gets together: Not on file    Attends religious service: Not on file    Active member of club or organization: Not on file    Attends meetings of clubs or organizations: Not on file    Relationship status: Not on file  Other Topics Concern  . Not on file  Social History Narrative  . Not on file    Vitals:   12/14/17 0820  BP: 104/70  Pulse: (!) 102  Resp: 16  Temp: 98 F (36.7 C)  SpO2: 98%   Body mass index is 47.79 kg/m.   Physical Exam  Nursing note and vitals reviewed. Constitutional: She is oriented to person, place, and time. She appears well-developed. She does not appear ill. No distress.  HENT:  Head: Normocephalic and atraumatic.  Right Ear: Tympanic membrane, external ear and ear canal normal.  Left Ear: Tympanic membrane, external ear and ear canal normal.  Nose: Rhinorrhea present. Right sinus exhibits no maxillary sinus tenderness and no frontal sinus tenderness. Left sinus exhibits no maxillary sinus tenderness and no frontal sinus tenderness.  Mouth/Throat: Oropharynx is clear and moist and mucous membranes are normal.  Eyes: Conjunctivae are  normal.  Cardiovascular: Regular rhythm. Tachycardia present.  No murmur heard. Respiratory: Effort normal and breath sounds normal. No respiratory distress. She has no rales.  Prolonged expiration.  Lymphadenopathy:       Head (right side): No submandibular adenopathy present.       Head (left side): No submandibular adenopathy present.    She has no cervical adenopathy.  Neurological: She is alert and oriented to person, place, and time. She has normal strength.  Skin: Skin is warm. No rash noted. No erythema.  Psychiatric: She has a normal mood and affect. Her speech is normal.  Well groomed, good eye contact.      ASSESSMENT AND PLAN:   Ms. Makinlee was seen today for cough.  Diagnoses and all orders for this visit:  Asthma exacerbation,  mild  Albuterol inh 2 puff every 6 hours for a week then as needed for wheezing or shortness of breath.  Prednisone side effects discussed. Instructed about warning signs.  -     CBC with Differential/Platelet -     DG Chest 2 View; Future -     predniSONE (DELTASONE) 10 MG tablet; Take 4 tablets (40 mg total) by mouth daily with breakfast for 5 days.  URI, acute  Symptoms suggests a viral etiology, I do not think abx is needed at this time. Instructed to monitor for signs of complications, including new onset of fever among some, clearly instructed about warning signs. I also explained that cough and nasal congestion can last a few days and sometimes weeks. Further recommendations will be given according to labs/imaging results.   F/U as needed.  Exertional dyspnea  No respiratory distress. Nasal congestion may be also contributing to SOB. Albuterol inh x 7 days.  Further recommendations according to results of CXR and CBC.   -     CBC with Differential/Platelet -     DG Chest 2 View; Future     Kemiyah Tarazon G. [place], MD  [facility]. [place] office.

## 2017-12-14 NOTE — Patient Instructions (Addendum)
A few things to remember from today's visit:   Asthma exacerbation, mild - Plan: CBC with Differential/Platelet, DG Chest 2 View, predniSONE (DELTASONE) 10 MG tablet  URI, acute  Exertional dyspnea - Plan: CBC with Differential/Platelet, DG Chest 2 View  viral infections are self-limited and we treat each symptom depending of severity.  Over the counter medications as decongestants and cold medications usually help, they need to be taken with caution if there is a history of high blood pressure or palpitations.  Tylenol and/or Ibuprofen also helps with most symptoms (headache, muscle aching, fever,etc) Plenty of fluids. Honey helps with cough. Steam inhalations helps with runny nose, nasal congestion, and may prevent sinus infections. Cough and nasal congestion could last a few days and sometimes weeks. Please follow in not any better in 1-2 weeks or if symptoms get worse.   Albuterol inh 2 puff every 4 hours for a week then as needed for wheezing or shortness of breath.   Today X ray was ordered.  This can be done at [facility] at [facility] between 8 am and 5 pm: [address] [place]. [phone].   Please be sure medication list is accurate. If a new problem present, please set up appointment sooner than planned today.

## 2017-12-15 ENCOUNTER — Encounter: Payer: Self-pay | Admitting: Family Medicine

## 2017-12-17 ENCOUNTER — Encounter: Payer: Self-pay | Admitting: Family Medicine

## 2018-01-11 IMAGING — MR MR KNEE*L* W/O CM
4 of 6 series · 20 of 40 positions shown · non-contrast
Comparison: None.

CLINICAL DATA: Medial left knee pain and swelling for 3 months. No
known injury.

EXAM:
MRI OF THE LEFT KNEE WITHOUT CONTRAST
TECHNIQUE: Multiplanar, multisequence MR imaging of the knee was performed. No
intravenous contrast was administered.

[Series 3: pd_tse_fs_tra · axial · 4.0mm · 0.47mm/px · z∈[-39,+40]mm · 3 of 26 slices shown]
[im 4/26]
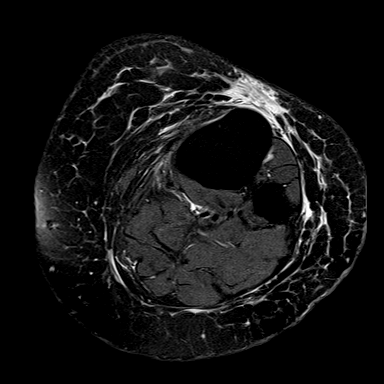
[im 15/26]
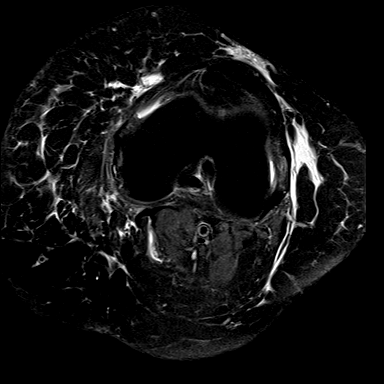
[im 22/26]
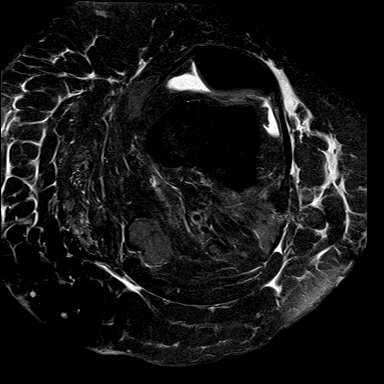

[Series 5: PD fat-sat · sagittal · 3.5mm · 0.25mm/px · 7 of 25 slices shown]
[im 1/25]
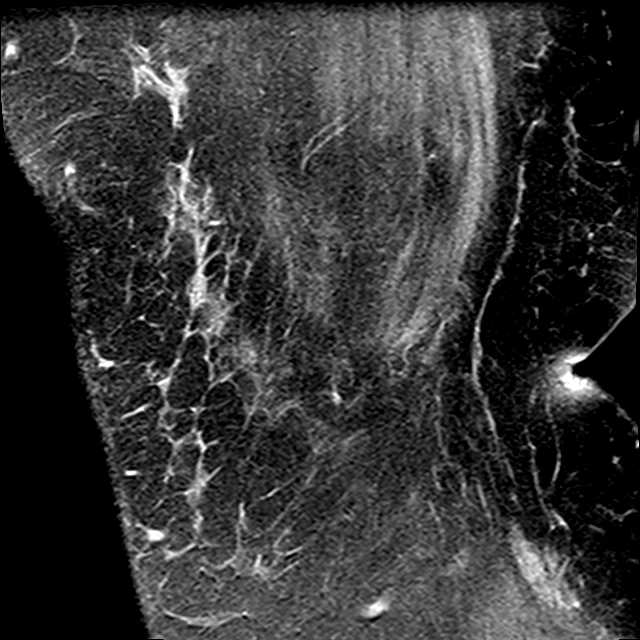
[im 5/25]
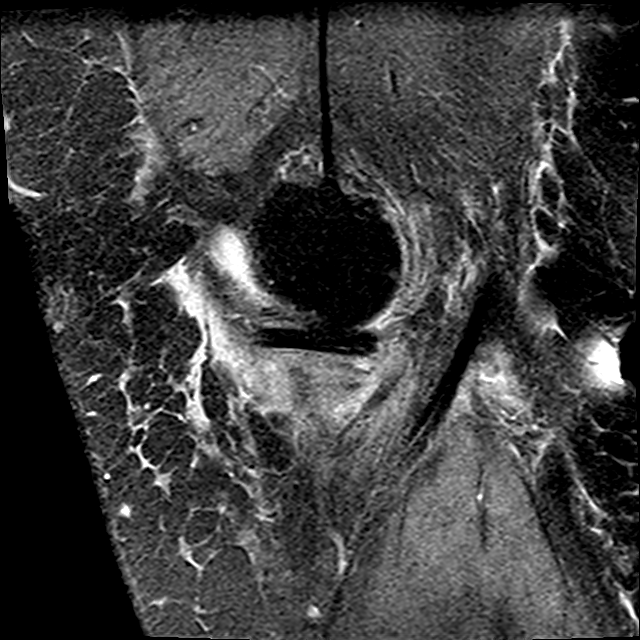
[im 9/25]
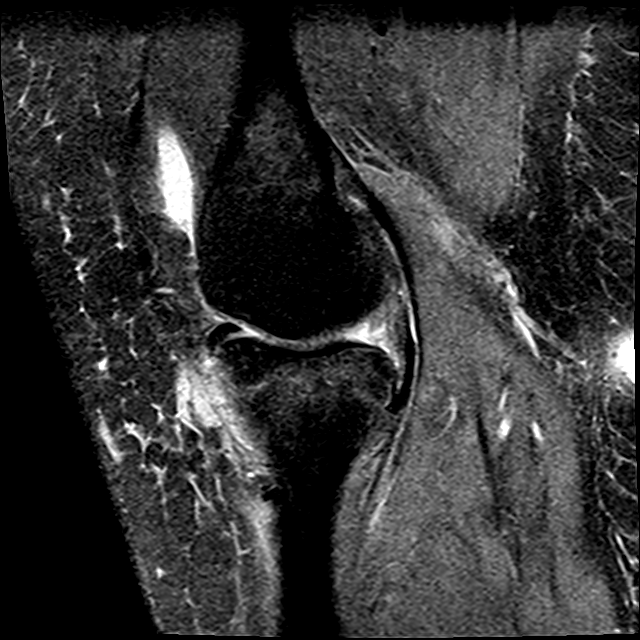
[im 13/25]
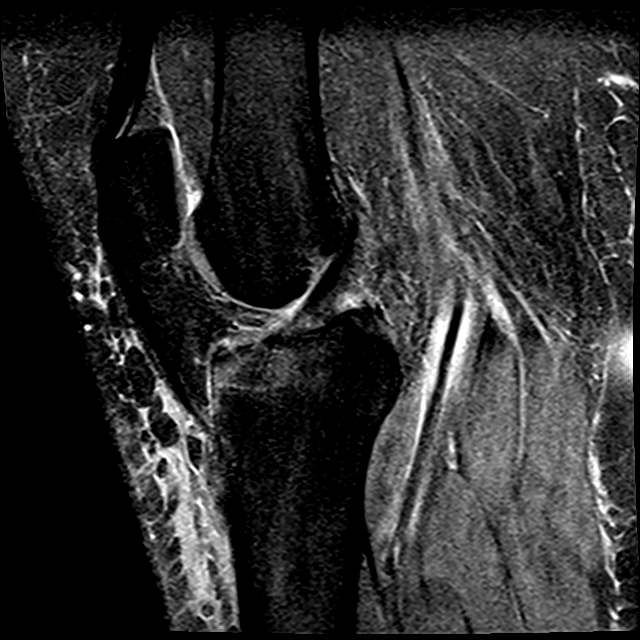
[im 17/25]
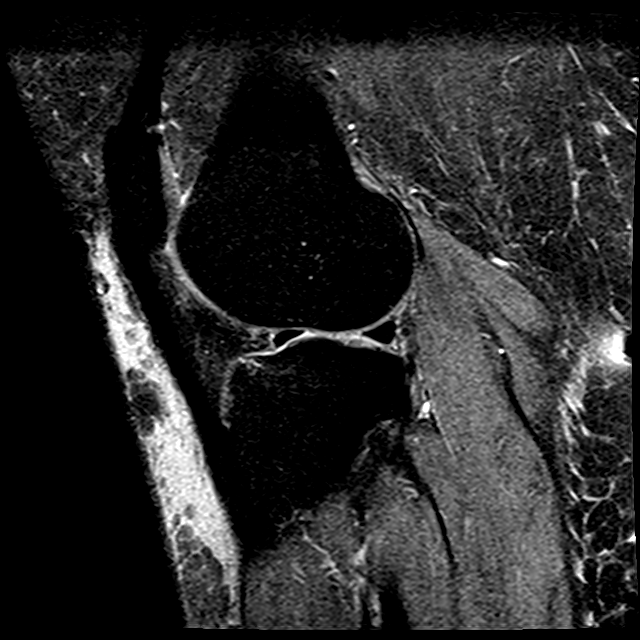
[im 21/25]
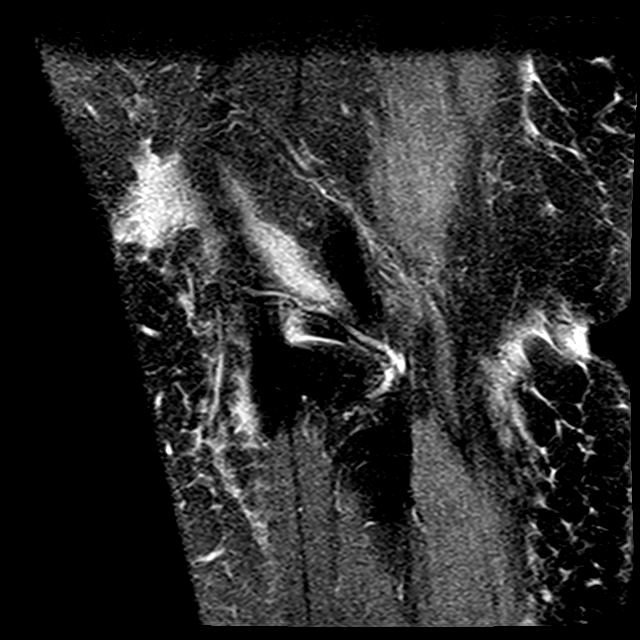
[im 25/25]
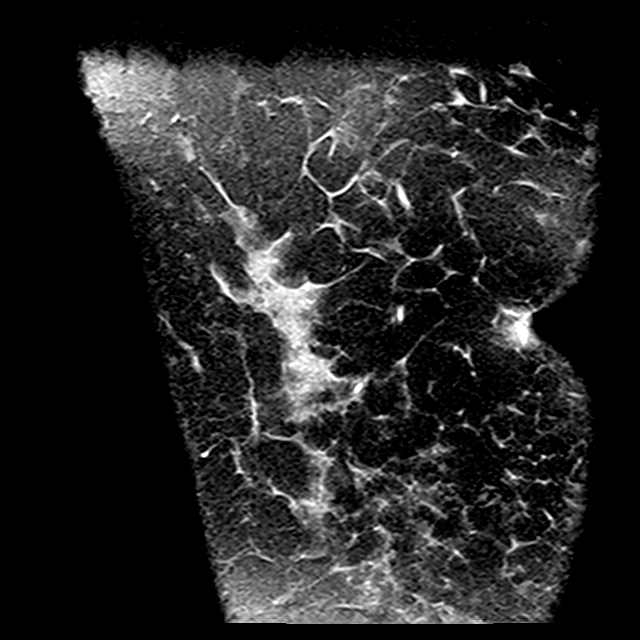

[Series 6: T2 fat-sat · coronal · 3.2mm · 0.62mm/px · 7 of 26 slices shown]
[im 1/26]
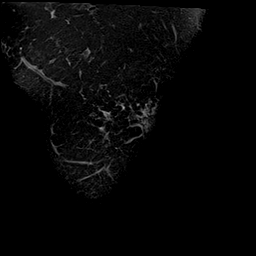
[im 5/26]
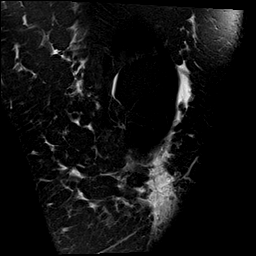
[im 9/26]
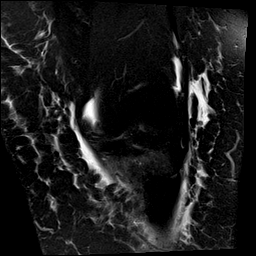
[im 13/26]
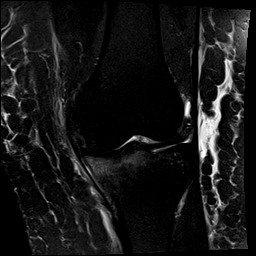
[im 17/26]
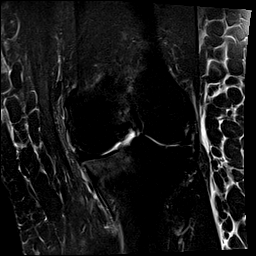
[im 21/26]
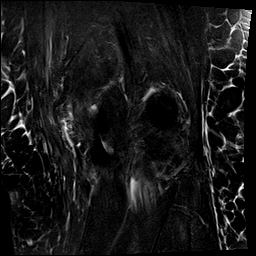
[im 26/26]
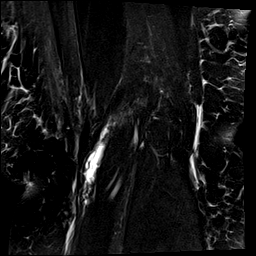

[Series 7: T1 · coronal · 3.2mm · 0.25mm/px · 3 of 26 slices shown]
[im 5/26]
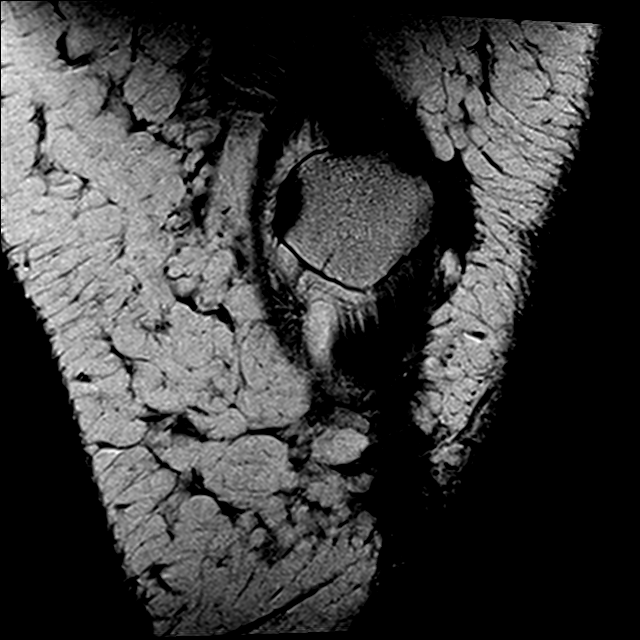
[im 13/26]
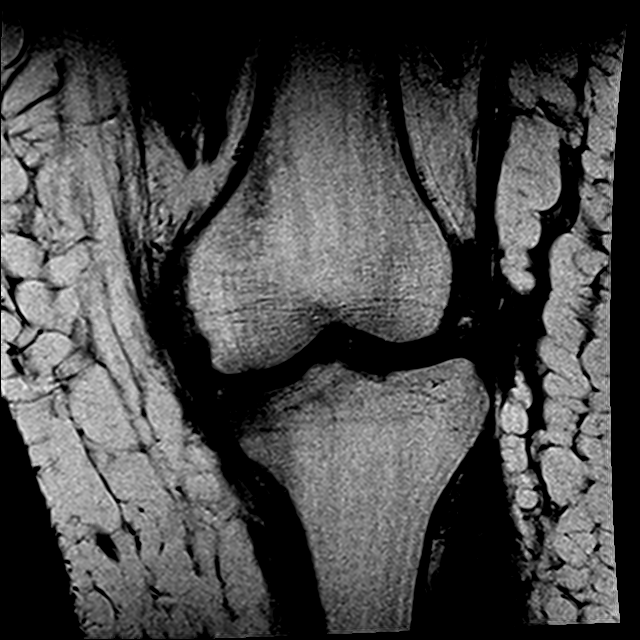
[im 21/26]
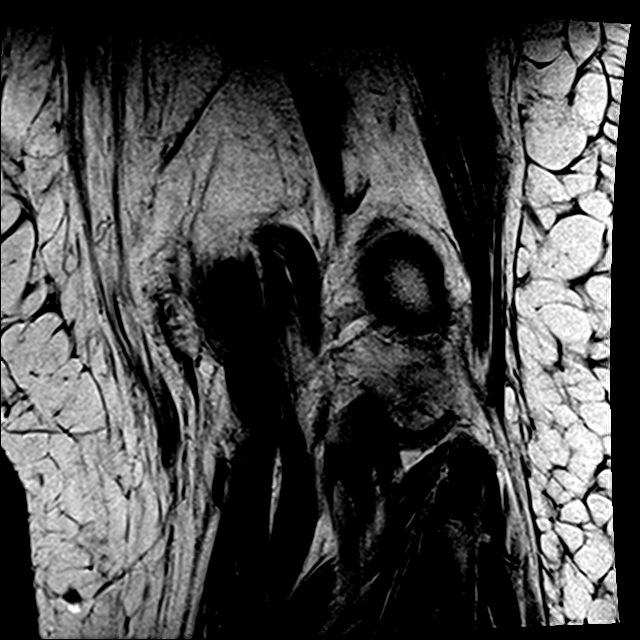

[20 of 40 positions shown; findings below may reference images not displayed]

FINDINGS: MENISCI

Medial meniscus: The patient has a large, complete radial tear of
the root of the posterior horn of the medial meniscus. Gap at the
tear measures approximately 0.9 cm.

Lateral meniscus:  Intact.

LIGAMENTS

Cruciates:  Intact.

Collaterals:  Intact.

CARTILAGE

Patellofemoral:  Preserved.

Medial:  Minimally degenerated.

Lateral:  Preserved.

Joint:  Small effusion.

Popliteal Fossa:  Small Baker's cyst.

Extensor Mechanism:  Intact.

Bones: Marrow edema is seen in the medial tibial plateau due to a
very small, nondisplaced subchondral fracture.

Other: None.
IMPRESSION: Large radial tear root of the posterior horn of the medial meniscus.

Very small, nondisplaced subchondral fracture medial tibial plateau
with associated marrow edema.

Small Baker's cyst.

## 2018-03-11 ENCOUNTER — Other Ambulatory Visit: Payer: Self-pay | Admitting: Family Medicine

## 2018-03-11 DIAGNOSIS — I10 Essential (primary) hypertension: Secondary | ICD-10-CM

## 2019-03-18 ENCOUNTER — Other Ambulatory Visit: Payer: Self-pay | Admitting: Family Medicine

## 2019-03-18 DIAGNOSIS — I10 Essential (primary) hypertension: Secondary | ICD-10-CM

## 2019-04-24 ENCOUNTER — Other Ambulatory Visit: Payer: Self-pay

## 2019-04-24 ENCOUNTER — Encounter: Payer: Self-pay | Admitting: Family Medicine

## 2019-04-24 ENCOUNTER — Ambulatory Visit (INDEPENDENT_AMBULATORY_CARE_PROVIDER_SITE_OTHER): Payer: BC Managed Care – PPO | Admitting: Family Medicine

## 2019-04-24 VITALS — BP 110/76 | HR 94 | Temp 97.8°F | Resp 12 | Ht 63.0 in | Wt 294.4 lb

## 2019-04-24 DIAGNOSIS — Z130 Encounter for screening for diseases of the blood and blood-forming organs and certain disorders involving the immune mechanism: Secondary | ICD-10-CM | POA: Diagnosis not present

## 2019-04-24 DIAGNOSIS — E559 Vitamin D deficiency, unspecified: Secondary | ICD-10-CM

## 2019-04-24 DIAGNOSIS — E785 Hyperlipidemia, unspecified: Secondary | ICD-10-CM | POA: Diagnosis not present

## 2019-04-24 DIAGNOSIS — R600 Localized edema: Secondary | ICD-10-CM

## 2019-04-24 DIAGNOSIS — Z1329 Encounter for screening for other suspected endocrine disorder: Secondary | ICD-10-CM

## 2019-04-24 DIAGNOSIS — Z0000 Encounter for general adult medical examination without abnormal findings: Secondary | ICD-10-CM

## 2019-04-24 DIAGNOSIS — Z6841 Body Mass Index (BMI) 40.0 and over, adult: Secondary | ICD-10-CM

## 2019-04-24 DIAGNOSIS — J4520 Mild intermittent asthma, uncomplicated: Secondary | ICD-10-CM

## 2019-04-24 DIAGNOSIS — Z13228 Encounter for screening for other metabolic disorders: Secondary | ICD-10-CM | POA: Diagnosis not present

## 2019-04-24 DIAGNOSIS — Z23 Encounter for immunization: Secondary | ICD-10-CM

## 2019-04-24 DIAGNOSIS — R8290 Unspecified abnormal findings in urine: Secondary | ICD-10-CM

## 2019-04-24 DIAGNOSIS — R002 Palpitations: Secondary | ICD-10-CM

## 2019-04-24 DIAGNOSIS — I10 Essential (primary) hypertension: Secondary | ICD-10-CM

## 2019-04-24 LAB — URINALYSIS, ROUTINE W REFLEX MICROSCOPIC
Bilirubin Urine: NEGATIVE
Ketones, ur: NEGATIVE
Leukocytes,Ua: NEGATIVE
Nitrite: NEGATIVE
RBC / HPF: NONE SEEN (ref 0–?)
Specific Gravity, Urine: 1.025 (ref 1.000–1.030)
Total Protein, Urine: NEGATIVE
Urine Glucose: NEGATIVE
Urobilinogen, UA: 0.2 (ref 0.0–1.0)
pH: 5.5 (ref 5.0–8.0)

## 2019-04-24 LAB — COMPREHENSIVE METABOLIC PANEL
ALT: 14 U/L (ref 0–35)
AST: 16 U/L (ref 0–37)
Albumin: 4 g/dL (ref 3.5–5.2)
Alkaline Phosphatase: 79 U/L (ref 39–117)
BUN: 12 mg/dL (ref 6–23)
CO2: 29 mEq/L (ref 19–32)
Calcium: 9.6 mg/dL (ref 8.4–10.5)
Chloride: 103 mEq/L (ref 96–112)
Creatinine, Ser: 0.84 mg/dL (ref 0.40–1.20)
GFR: 82.06 mL/min (ref >60.00)
Glucose, Bld: 93 mg/dL (ref 70–99)
Potassium: 3.9 mEq/L (ref 3.5–5.1)
Sodium: 141 mEq/L (ref 135–145)
Total Bilirubin: 0.8 mg/dL (ref 0.2–1.2)
Total Protein: 7.2 g/dL (ref 6.0–8.3)

## 2019-04-24 LAB — LIPID PANEL
Cholesterol: 200 mg/dL (ref 0–200)
HDL: 80.7 mg/dL (ref >39.00)
LDL Cholesterol: 105 mg/dL — ABNORMAL HIGH (ref 0–99)
NonHDL: 119.2
Total CHOL/HDL Ratio: 2
Triglycerides: 72 mg/dL (ref 0.0–149.0)
VLDL: 14.4 mg/dL (ref 0.0–40.0)

## 2019-04-24 LAB — VITAMIN D 25 HYDROXY (VIT D DEFICIENCY, FRACTURES): VITD: 28.92 ng/mL — ABNORMAL LOW (ref 30.00–100.00)

## 2019-04-24 LAB — HEMOGLOBIN A1C: Hgb A1c MFr Bld: 5.6 % (ref 4.6–6.5)

## 2019-04-24 NOTE — Patient Instructions (Addendum)
Today you have you routine preventive visit. A few things to remember from today's visit:   Routine general medical examination at a health care facility  Hyperlipidemia, unspecified hyperlipidemia type - Plan: Lipid panel  Hypertension, essential, benign  Vitamin D deficiency, unspecified - Plan: VITAMIN D 25 Hydroxy (Vit-D Deficiency, Fractures)  Irregular heart rhythm - Plan: EKG 12-Lead  Bilateral lower extremity edema  Bad odor of urine - Plan: Urinalysis, Routine w reflex microscopic  Screening for endocrine, metabolic and immunity disorder - Plan: Hemoglobin A1c, Comprehensive metabolic panel    At least 150 minutes of moderate exercise per week, daily brisk walking for 15-30 min is a good exercise option. Healthy diet low in saturated (animal) fats and sweets and consisting of fresh fruits and vegetables, lean meats such as fish and white chicken and whole grains.  These are some of recommendations for screening depending of age and risk factors:   - Vaccines:  Tdap vaccine every 10 years.  Shingles vaccine recommended at age 47, could be given after 66 years of age but not sure about insurance coverage.   Pneumonia vaccines:  Prevnar 13 at 65 and Pneumovax at 61. Sometimes Pneumovax is giving earlier if history of smoking, lung disease,diabetes,kidney disease among some.    Screening for diabetes at age 98 and every 3 years.  Cervical cancer prevention:  Pap smear starts at 66 years of age and continues periodically until 66 years old in low risk women. Pap smear every 3 years between 64 and 32 years old. Pap smear every 3-5 years between women 76 and older if pap smear negative and HPV screening negative.   -Breast cancer: Mammogram: There is disagreement between experts about when to start screening in low risk asymptomatic female but recent recommendations are to start screening at 42 and not later than 66 years old , every 1-2 years and after 66 yo q 2  years. Screening is recommended until 66 years old but some women can continue screening depending of healthy issues.   Colon cancer screening: starts at 66 years old until 66 years old.  Also recommended:  1. Dental visit- Brush and floss your teeth twice daily; visit your dentist twice a year. 2. Eye doctor- Get an eye exam at least every 2 years. 3. Helmet use- Always wear a helmet when riding a bicycle, motorcycle, rollerblading or skateboarding. 4. Safe sex- If you may be exposed to sexually transmitted infections, use a condom. 5. Seat belts- Seat belts can save your live; always wear one. 6. Smoke/Carbon Monoxide detectors- These detectors need to be installed on the appropriate level of your home. Replace batteries at least once a year. 7. Skin cancer- When out in the sun please cover up and use sunscreen 15 SPF or higher. 8. Violence- If anyone is threatening or hurting you, please tell your healthcare provider.  9. Drink alcohol in moderation- Limit alcohol intake to one drink or less per day. Never drink and drive.

## 2019-04-24 NOTE — Progress Notes (Signed)
HPI:   [name] is a 68 y.o. female, who is here today for her routine physical.  Last CPE: 05/30/2017.  Regular exercise 3 or more time per week: "Very little." Following a healthy diet: Not consistently, eating a lot of bread. She lives with her 66 yo son.  Chronic medical problems: Vit D deficiency,HTN,and HLD among some.  Immunization History  Administered Date(s) Administered  . Influenza,inj,Quad PF,6+ Mos 04/24/2019  . Influenza-Unspecified 08/16/2016, 05/15/2017  . Pneumococcal Polysaccharide-23 04/24/2019    Mammogram: Around 4 years ago. Colonoscopy: 08/02/2017. DEXA: Osteopenia, 01/2010.  Mammogram and DEXA were ordered last year,she did not keep appts.  Hep C screening: 05/30/17 NR  She has som concerns today.  Palpitations for a while , sometimes associated with lightheadedness. Negative for chest pain,dyspnea,or diaphoresis.  LE edema: Noted at the end of the day,noted for the past 2 weeks. No erythema or pain. Alleviated by LE elevation,not present in the morning when she first gets up.  Odorous urine:  Intermittently for a while. Denies dysuria,increased urinary frequency, gross hematuria,or decreased urine output. She has not identified exacerbating or alleviating factors.  Asthma: She is on Albuterol inh. She does not use Albuterol in frequently. Last asthma exacerbation about a year ago.  Vit D deficiency: She is not taking Vit D supplementation daily, forgets frequently. Last 25 OH vit D was low at 28.1 a year ago.  HTN: She is on Almesartan-HCTZ 20-12.5 mg daily. She is not checking BP regularly. Denies severe/frequent headache, visual changes, chest pain, dyspnea, palpitation, claudication,or focal weakness.   Review of Systems  Constitutional: Negative for appetite change, fatigue and fever.  HENT: Negative for hearing loss, mouth sores, trouble swallowing and voice change.   Eyes: Negative for redness and visual  disturbance.  Respiratory: Negative for cough and wheezing.   Gastrointestinal: Negative for abdominal pain, nausea and vomiting.       No changes in bowel habits.  Endocrine: Negative for cold intolerance, heat intolerance, polydipsia, polyphagia and polyuria.  Genitourinary: Negative for decreased urine volume, pelvic pain, vaginal bleeding and vaginal discharge.  Musculoskeletal: Negative for gait problem and myalgias.  Skin: Negative for color change and rash.  Allergic/Immunologic: Positive for environmental allergies.  Neurological: Negative for syncope and headaches.  Psychiatric/Behavioral: Negative for confusion and sleep disturbance. The patient is not nervous/anxious.   All other systems reviewed and are negative.   Current Outpatient Medications on File Prior to Visit  Medication Sig Dispense Refill  . Cholecalciferol (HM VITAMIN D3) 4000 units CAPS Take 4,000 Units by mouth daily.    Marland Kitchen. loratadine (CLARITIN) 10 MG tablet Take 10 mg by mouth daily.     Current Facility-Administered Medications on File Prior to Visit  Medication Dose Route Frequency Provider Last Rate Last Dose  . 0.9 %  sodium chloride infusion  500 mL Intravenous Once Pyrtle, Carie CaddyJay M, MD         Past Medical History:  Diagnosis Date  . Allergy    seasonal  . Anemia    past hx   . Arthritis   . Asthma   . Chicken pox   . Chronic kidney disease   . Family history of colon cancer in father    age 66 and sister age 66   . GERD (gastroesophageal reflux disease)   . History of frequent urinary tract infections   . Hypertension   . Obesity   . Osteopenia   . Peptic ulcer  history of PUD    Past Surgical History:  Procedure Laterality Date  . ABDOMINAL HYSTERECTOMY  1996  . BREAST SURGERY  2014   biopsy  . COLONOSCOPY  2004   [facility]- normal   . TONSILLECTOMY     At 68 yrs old    Allergies  Allergen Reactions  . Codeine Palpitations  . Erythromycin Hives and Nausea And Vomiting   . Other     Bees, mold, grass, cats     Family History  Problem Relation Age of Onset  . Cancer Sister        breast  . Hypertension Sister   . Deep vein thrombosis Sister   . Breast cancer Sister   . Heart disease Mother        CHF  . Hypertension Mother   . Deep vein thrombosis Mother   . Cancer Father        Colon  . Colon cancer Father 82  . Cancer Sister        Colon  . Colon cancer Sister 21  . Hypertension Brother   . Colon polyps Brother   . Esophageal cancer Neg Hx   . Rectal cancer Neg Hx   . Stomach cancer Neg Hx     Social History   Socioeconomic History  . Marital status: Widowed    Spouse name: Not on file  . Number of children: Not on file  . Years of education: Not on file  . Highest education level: Not on file  Occupational History  . Not on file  Social Needs  . Financial resource strain: Not on file  . Food insecurity    Worry: Not on file    Inability: Not on file  . Transportation needs    Medical: Not on file    Non-medical: Not on file  Tobacco Use  . Smoking status: Never Smoker  . Smokeless tobacco: Never Used  Substance and Sexual Activity  . Alcohol use: No    Alcohol/week: 1.0 standard drinks    Types: 1 Glasses of wine per week    Frequency: Never    Comment: 1 glass wine Q 3-4 months   . Drug use: No  . Sexual activity: Not on file  Lifestyle  . Physical activity    Days per week: Not on file    Minutes per session: Not on file  . Stress: Not on file  Relationships  . Social Herbalist on phone: Not on file    Gets together: Not on file    Attends religious service: Not on file    Active member of club or organization: Not on file    Attends meetings of clubs or organizations: Not on file    Relationship status: Not on file  Other Topics Concern  . Not on file  Social History Narrative  . Not on file     Vitals:   04/24/19 0901  BP: 110/76  Pulse: 94  Resp: 12  Temp: 97.8 F (36.6 C)  SpO2:  96%   Body mass index is 52.15 kg/m.   Wt Readings from Last 3 Encounters:  04/24/19 294 lb 6 oz (133.5 kg)  12/14/17 269 lb 12.8 oz (122.4 kg)  08/02/17 264 lb (119.7 kg)    Physical Exam  Nursing note and vitals reviewed. Constitutional: She is oriented to person, place, and time. She appears well-developed. No distress.  HENT:  Head: Normocephalic and atraumatic.  Right Ear:  Hearing, tympanic membrane, external ear and ear canal normal.  Left Ear: Hearing, tympanic membrane, external ear and ear canal normal.  Mouth/Throat: Uvula is midline, oropharynx is clear and moist and mucous membranes are normal.  Eyes: Pupils are equal, round, and reactive to light. Conjunctivae and EOM are normal.  Neck: No tracheal deviation present. No thyromegaly present.  Cardiovascular: Normal rate. An irregular rhythm present.  No murmur heard. Pulses:      Dorsalis pedis pulses are 2+ on the right side and 2+ on the left side.  Respiratory: Effort normal and breath sounds normal. No respiratory distress. No breast swelling or tenderness.  GI: Soft. She exhibits no mass. There is no hepatomegaly. There is no abdominal tenderness.  Genitourinary:    Genitourinary Comments: Breast: No masses,nipple discharge,or skin abnormalities appreciated,bilateral.   Musculoskeletal:        General: Edema (Trace pitting edema LE,bilateral.) present.     Comments: No major deformity or signs of synovitis appreciated.  Lymphadenopathy:    She has no cervical adenopathy.    She has no axillary adenopathy.       Right: No supraclavicular adenopathy present.       Left: No supraclavicular adenopathy present.  Neurological: She is alert and oriented to person, place, and time. She has normal strength. No cranial nerve deficit. Coordination and gait normal.  Reflex Scores:      Bicep reflexes are 2+ on the right side and 2+ on the left side.      Patellar reflexes are 2+ on the right side and 2+ on the left side.  Skin: Skin is warm. No rash noted. No erythema.  Psychiatric: She has a normal mood and affect. Cognition and memory are normal.  Well groomed, good eye contact.    ASSESSMENT AND PLAN:  Ms. DARIANNE MURALLES was here today annual physical examination.  Orders Placed This Encounter  Procedures  . Pneumococcal polysaccharide vaccine 23-valent greater than or equal to 2yo subcutaneous/IM  . Flu Vaccine QUAD 36+ mos IM  . Lipid panel  . Hemoglobin A1c  . Comprehensive metabolic panel  . VITAMIN D 25 Hydroxy (Vit-D Deficiency, Fractures)  . Urinalysis, Routine w reflex microscopic  . Ambulatory referral to Cardiology  . EKG 12-Lead   Lab Results  Component Value Date   CHOL 200 04/24/2019   HDL 80.70 04/24/2019   LDLCALC 105 (H) 04/24/2019   TRIG 72.0 04/24/2019   CHOLHDL 2 04/24/2019    Lab Results  Component Value Date   HGBA1C 5.6 04/24/2019   Lab Results  Component Value Date   CREATININE 0.84 04/24/2019   BUN 12 04/24/2019   NA 141 04/24/2019   K 3.9 04/24/2019   CL 103 04/24/2019   CO2 29 04/24/2019   Lab Results  Component Value Date   ALT 14 04/24/2019   AST 16 04/24/2019   ALKPHOS 79 04/24/2019   BILITOT 0.8 04/24/2019     Routine general medical examination at a health care facility We discussed the importance of regular physical activity and healthy diet for prevention of chronic illness and/or complications. Preventive guidelines reviewed. Vaccination updated. Strongly recommend re-scheduling DEXA and mammogram.  Ca++ and vit D supplementation recommended. Next CPE in a year. Further recommendations will be given according to labs/imaging results.  The 10-year ASCVD risk score Denman George DC Jr., et al., 2013) is: 6.5%   Values used to calculate the score:     Age: 59 years     Sex: Female  Is Non-Hispanic African American: Yes     Diabetic: No     Tobacco smoker: No     Systolic Blood Pressure: 110 mmHg     Is BP treated: Yes     HDL  Cholesterol: 80.7 mg/dL     Total Cholesterol: 200 mg/dL  Hyperlipidemia, unspecified hyperlipidemia type Low fat diet recommended. Further recommendations will be given according to lipid results and 10 years CV risk.  -     Lipid panel  Hypertension, essential, benign BP adequately controlled. Continue low salt diet. Monitor BP regularly.  Vitamin D deficiency, unspecified No changes in current management, will follow labs done today and will give further recommendations accordingly.  -     VITAMIN D 25 Hydroxy (Vit-D Deficiency, Fractures)  Heart palpitations With associated lightheadedness. EKG today: SR,normal axis, PAC's vs sinus arrhythmia. Some artifact present.No other EKG for comparison. Instructed about warning signs.  -     EKG 12-Lead -     Ambulatory referral to Cardiology  Bilateral lower extremity edema Most likely caused by vein disease. LE elevation a few times during the afternoon and compression stocking may help.  Bad odor of urine Possible causes discussed. Adequate hydration. Further recommendations according to UA.  -     Urinalysis, Routine w reflex microscopic  Screening for endocrine, metabolic and immunity disorder -     Hemoglobin A1c -     Comprehensive metabolic panel  Need for 23-polyvalent pneumococcal polysaccharide vaccine -     Pneumococcal polysaccharide vaccine 23-valent greater than or equal to 2yo subcutaneous/IM  Need for influenza vaccination -     Flu Vaccine QUAD 36+ mos IM  Class 3 severe obesity due to excess calories with serious comorbidity and body mass index (BMI) of 50.0 to 59.9 in adult [facility]) She has gained about 15 Lb since ehr last OV. We discussed benefits of wt loss as well as adverse effects of obesity. Consistency with healthy diet and physical activity recommended.  Asthma in adult, mild intermittent, uncomplicated Well controlled. Wt loss encouraged.   Return in 6 months (on 10/23/2019) for HTN and wt.     Betty G. [place], MD  [facility]. [place] office.

## 2019-04-28 ENCOUNTER — Encounter: Payer: Self-pay | Admitting: Family Medicine

## 2019-04-28 MED ORDER — ALBUTEROL SULFATE HFA 108 (90 BASE) MCG/ACT IN AERS: 2.0000 | INHALATION_SPRAY | Freq: Four times a day (QID) | RESPIRATORY_TRACT | 3 refills | Status: DC | PRN

## 2019-04-28 MED ORDER — OLMESARTAN MEDOXOMIL-HCTZ 20-12.5 MG PO TABS: 1.0000 | ORAL_TABLET | Freq: Every day | ORAL | 2 refills | Status: DC

## 2019-05-30 ENCOUNTER — Ambulatory Visit: Payer: BC Managed Care – PPO | Admitting: Interventional Cardiology

## 2019-06-01 NOTE — Progress Notes (Signed)
Cardiology Office Note:    Date:  06/03/2019   ID:  [name], DOB 1950-08-18, MRN [id number]  PCP:  [place], Betty G, MD  Cardiologist:  No primary care provider on file.   Referring MD: [place], Betty G, MD   Chief Complaint  Patient presents with  . Irregular Heart Beat  . Chest Pain  . Shortness of Breath    History of Present Illness:    Sonya Gilbert is a 68 y.o. female  referred by Betty Sw[place]and MD for h/o palpitations, shortness of breath, and chest pain.  There is background hypertension, CKD, Obesity, snoring and asthma.  She works as a Emergency planning/management officer for [employer].  She has dyspnea on exertion.  This occurs when she tries to walk up and down the stairs in her house.  When she walks in her neighborhood, which is rare, she has dyspnea.  There is no associated discomfort.  She has history of palpitations that are more apparent at rest.  She had previous work-up by Dr. Jacinto Halim for that problem.  He did a work-up which included an ultrasound.  No significant abnormalities were found.  He encouraged risk factor modification including weight loss.  She now states that on occasion she has discomfort in her chest.  This is almost always while sitting.  It is upper parasternal region.  It feels like pressure.  It does not radiate to the neck or arms.  May occur once per month.  It is not exertion related.  Past Medical History:  Diagnosis Date  . Allergy    seasonal  . Anemia    past hx   . Arthritis   . Asthma   . Chicken pox   . Chronic kidney disease   . Family history of colon cancer in father    age 41 and sister age 18   . GERD (gastroesophageal reflux disease)   . History of frequent urinary tract infections   . Hypertension   . Obesity   . Osteopenia   . Peptic ulcer    history of PUD    Past Surgical History:  Procedure Laterality Date  . ABDOMINAL HYSTERECTOMY  1996  . BREAST SURGERY  2014   biopsy  . COLONOSCOPY  2004   [facility]- normal   .  TONSILLECTOMY     At 68 yrs old    Current Medications: Current Meds  Medication Sig  . albuterol (VENTOLIN HFA) 108 (90 Base) MCG/ACT inhaler Inhale 2 puffs into the lungs every 6 (six) hours as needed for wheezing or shortness of breath.  . Cholecalciferol (HM VITAMIN D3) 4000 units CAPS Take 4,000 Units by mouth daily.  Marland Kitchen loratadine (CLARITIN) 10 MG tablet Take 10 mg by mouth daily.  Marland Kitchen olmesartan-hydrochlorothiazide (BENICAR HCT) 20-12.5 MG tablet Take 1 tablet by mouth daily.   Current Facility-Administered Medications for the 06/03/19 encounter (Office Visit) with Lyn Records, MD  Medication  . 0.9 %  sodium chloride infusion     Allergies:   Codeine, Erythromycin, and Other   Social History   Socioeconomic History  . Marital status: Widowed    Spouse name: Not on file  . Number of children: Not on file  . Years of education: Not on file  . Highest education level: Not on file  Occupational History  . Not on file  Social Needs  . Financial resource strain: Not on file  . Food insecurity    Worry: Not on file  Inability: Not on file  . Transportation needs    Medical: Not on file    Non-medical: Not on file  Tobacco Use  . Smoking status: Never Smoker  . Smokeless tobacco: Never Used  Substance and Sexual Activity  . Alcohol use: No    Alcohol/week: 1.0 standard drinks    Types: 1 Glasses of wine per week    Frequency: Never    Comment: 1 glass wine Q 3-4 months   . Drug use: No  . Sexual activity: Not on file  Lifestyle  . Physical activity    Days per week: Not on file    Minutes per session: Not on file  . Stress: Not on file  Relationships  . Social Musician on phone: Not on file    Gets together: Not on file    Attends religious service: Not on file    Active member of club or organization: Not on file    Attends meetings of clubs or organizations: Not on file    Relationship status: Not on file  Other Topics Concern  . Not on  file  Social History Narrative  . Not on file     Family History: The patient's family history includes Breast cancer in her sister; Cancer in her father, sister, and sister; Colon cancer (age of onset: 70) in her sister; Colon cancer (age of onset: 75) in her father; Colon polyps in her brother; Deep vein thrombosis in her mother and sister; Heart disease in her mother; Hypertension in her brother, mother, and sister. There is no history of Esophageal cancer, Rectal cancer, or Stomach cancer.  ROS:   Please see the history of present illness.    Palpitations, significant left knee arthritis, snoring, restless sleep.  Feels fatigued on some occasions after having a good night sleep.  All other systems reviewed and are negative.  EKGs/Labs/Other Studies Reviewed:    The following studies were reviewed today: None  EKG:  EKG 04/18/2019 demonstrates normal sinus rhythm with sinus arrhythmia, and prominent voltage.  Recent Labs: 04/24/2019: ALT 14; BUN 12; Creatinine, Ser 0.84; Potassium 3.9; Sodium 141  Recent Lipid Panel    Component Value Date/Time   CHOL 200 04/24/2019 1019   TRIG 72.0 04/24/2019 1019   HDL 80.70 04/24/2019 1019   CHOLHDL 2 04/24/2019 1019   VLDL 14.4 04/24/2019 1019   LDLCALC 105 (H) 04/24/2019 1019    Physical Exam:    VS:  BP 132/74   Pulse 100   Ht 5\' 3"  (1.6 m)   Wt 292 lb 6.4 oz (132.6 kg)   SpO2 98%   BMI 51.80 kg/m     Wt Readings from Last 3 Encounters:  06/03/19 292 lb 6.4 oz (132.6 kg)  04/24/19 294 lb 6 oz (133.5 kg)  12/14/17 269 lb 12.8 oz (122.4 kg)     GEN: Morbid obesity. No acute distress HEENT: Normal NECK: No JVD. LYMPHATICS: No lymphadenopathy CARDIAC: Irregular RR without murmur, gallop, or edema. VASCULAR:  Normal Pulses. No bruits. RESPIRATORY:  Clear to auscultation without rales, wheezing or rhonchi  ABDOMEN: Soft, non-tender, non-distended, No pulsatile mass, MUSCULOSKELETAL: No deformity  SKIN: Warm and dry  NEUROLOGIC:  Alert and oriented x 3 PSYCHIATRIC:  Normal affect   ASSESSMENT:    1. Palpitations   2. Other chest pain   3. SOB (shortness of breath)   4. Hypertension, essential, benign   5. Prediabetes   6. Mixed hyperlipidemia   7.  Class 3 severe obesity due to excess calories with serious comorbidity and body mass index (BMI) of 45.0 to 49.9 in adult (HCC)   8. Snoring   9. Educated about COVID-19 virus infection    PLAN:    In order of problems listed above:  1. Has sinus arrhythmia.  A 7-day monitor will be placed to exclude more pathologic arrhythmia. 2. The discomfort is atypical.  May consider an ischemic evaluation but will start with simple assessment of LV function. 3. 2D Doppler echocardiogram will be done to assess LV function. 4. Blood pressure control is adequate. 5. Needs weight loss, decrease carbohydrates in diet, moderate aerobic activity. 6. Ideal LDL is 70 given mild elevation in hemoglobin A1c which was 5.6 in October. 7. We did discuss bariatric surgery.  She is not in favor of this.  She does have several indications which include debility from osteoarthritis of both knees, glycemic control/prediabetes, and suspected sleep disturbance. 8. If significant atrial arrhythmias are noted, consider sleep study 9. The 3W's are discussed and endorsed by the patient.  Plan is to perform a 2D Doppler echocardiogram and a 7-day monitor.  Return in 6 weeks for reassessment.  Further work-up will be dependent upon findings.  Discussed change in diet to include more vegetables and less carbohydrates.    Medication Adjustments/Labs and Tests Ordered: Current medicines are reviewed at length with the patient today.  Concerns regarding medicines are outlined above.  Orders Placed This Encounter  Procedures  . LONG TERM MONITOR (3-14 DAYS)  . ECHOCARDIOGRAM COMPLETE   No orders of the defined types were placed in this encounter.   Patient Instructions  Medication  Instructions:  Your physician recommends that you continue on your current medications as directed. Please refer to the Current Medication list given to you today.  *If you need a refill on your cardiac medications before your next appointment, please call your pharmacy*  Lab Work: None If you have labs (blood work) drawn today and your tests are completely normal, you will receive your results only by: Marland Kitchen. MyChart Message (if you have MyChart) OR . A paper copy in the mail If you have any lab test that is abnormal or we need to change your treatment, we will call you to review the results.  Testing/Procedures: Your physician has requested that you have an echocardiogram. Echocardiography is a painless test that uses sound waves to create images of your heart. It provides your doctor with information about the size and shape of your heart and how well your heart's chambers and valves are working. This procedure takes approximately one hour. There are no restrictions for this procedure.  Your physician recommends that you wear a monitor for 7 days.  Follow-Up: At [facility], you and your health needs are our priority.  As part of our continuing mission to provide you with exceptional heart care, we have created designated Provider Care Teams.  These Care Teams include your primary Cardiologist (physician) and Advanced Practice Providers (APPs -  Physician Assistants and Nurse Practitioners) who all work together to provide you with the care you need, when you need it.  Your next appointment:   4-6 weeks (can have 1/7 at 11:40A or 2P)  The format for your next appointment:   In Person  Provider:   You may see Dr. Verdis PrimeHenry Smith or one of the following Advanced Practice Providers on your designated Care Team:    Norma FredricksonLori Gerhardt, NP  Nada BoozerLaura Ingold, NP  Noreene LarssonJill  Glenford Peers, NP   Other Instructions      Signed, Sinclair Grooms, MD  06/03/2019 12:28 PM    [facility]

## 2019-06-03 ENCOUNTER — Other Ambulatory Visit: Payer: Self-pay

## 2019-06-03 ENCOUNTER — Ambulatory Visit (INDEPENDENT_AMBULATORY_CARE_PROVIDER_SITE_OTHER): Payer: BC Managed Care – PPO | Admitting: Interventional Cardiology

## 2019-06-03 ENCOUNTER — Telehealth: Payer: Self-pay

## 2019-06-03 ENCOUNTER — Encounter: Payer: Self-pay | Admitting: Interventional Cardiology

## 2019-06-03 VITALS — BP 132/74 | HR 100 | Ht 63.0 in | Wt 292.4 lb

## 2019-06-03 DIAGNOSIS — Z6841 Body Mass Index (BMI) 40.0 and over, adult: Secondary | ICD-10-CM

## 2019-06-03 DIAGNOSIS — Z7189 Other specified counseling: Secondary | ICD-10-CM

## 2019-06-03 DIAGNOSIS — R0602 Shortness of breath: Secondary | ICD-10-CM | POA: Diagnosis not present

## 2019-06-03 DIAGNOSIS — I10 Essential (primary) hypertension: Secondary | ICD-10-CM

## 2019-06-03 DIAGNOSIS — E782 Mixed hyperlipidemia: Secondary | ICD-10-CM

## 2019-06-03 DIAGNOSIS — E66813 Obesity, class 3: Secondary | ICD-10-CM

## 2019-06-03 DIAGNOSIS — R0789 Other chest pain: Secondary | ICD-10-CM

## 2019-06-03 DIAGNOSIS — R0683 Snoring: Secondary | ICD-10-CM

## 2019-06-03 DIAGNOSIS — R7303 Prediabetes: Secondary | ICD-10-CM

## 2019-06-03 DIAGNOSIS — R002 Palpitations: Secondary | ICD-10-CM

## 2019-06-03 NOTE — Patient Instructions (Addendum)
Medication Instructions:  Your physician recommends that you continue on your current medications as directed. Please refer to the Current Medication list given to you today.  *If you need a refill on your cardiac medications before your next appointment, please call your pharmacy*  Lab Work: None If you have labs (blood work) drawn today and your tests are completely normal, you will receive your results only by: Marland Kitchen MyChart Message (if you have MyChart) OR . A paper copy in the mail If you have any lab test that is abnormal or we need to change your treatment, we will call you to review the results.  Testing/Procedures: Your physician has requested that you have an echocardiogram. Echocardiography is a painless test that uses sound waves to create images of your heart. It provides your doctor with information about the size and shape of your heart and how well your heart's chambers and valves are working. This procedure takes approximately one hour. There are no restrictions for this procedure.  Your physician recommends that you wear a monitor for 7 days.  Follow-Up: At [facility], you and your health needs are our priority.  As part of our continuing mission to provide you with exceptional heart care, we have created designated Provider Care Teams.  These Care Teams include your primary Cardiologist (physician) and Advanced Practice Providers (APPs -  Physician Assistants and Nurse Practitioners) who all work together to provide you with the care you need, when you need it.  Your next appointment:   4-6 weeks (can have 1/7 at 11:40A or 2P)  The format for your next appointment:   In Person  Provider:   You may see Dr. Daneen Schick or one of the following Advanced Practice Providers on your designated Care Team:    Truitt Merle, NP  Cecilie Kicks, NP  Kathyrn Drown, NP   Other Instructions

## 2019-06-03 NOTE — Telephone Encounter (Signed)
Went over monitor instructions with pt during o/v with Dr Tamala Julian. 7 day ZIO ordered.

## 2019-06-17 ENCOUNTER — Other Ambulatory Visit: Payer: Self-pay

## 2019-06-17 ENCOUNTER — Ambulatory Visit (HOSPITAL_COMMUNITY): Payer: BC Managed Care – PPO | Attending: Cardiology

## 2019-06-17 DIAGNOSIS — R0789 Other chest pain: Secondary | ICD-10-CM | POA: Insufficient documentation

## 2019-06-17 DIAGNOSIS — R0602 Shortness of breath: Secondary | ICD-10-CM | POA: Diagnosis not present

## 2019-06-19 ENCOUNTER — Ambulatory Visit (INDEPENDENT_AMBULATORY_CARE_PROVIDER_SITE_OTHER): Payer: BC Managed Care – PPO

## 2019-06-19 DIAGNOSIS — R002 Palpitations: Secondary | ICD-10-CM | POA: Diagnosis not present

## 2019-07-23 NOTE — Progress Notes (Signed)
Cardiology Office Note:    Date:  07/25/2019   ID:  Sonya PROBASCO, DOB Aug 03, 1950, MRN [id number]  PCP:  Martinique, Betty G, MD  Cardiologist:  Sinclair Grooms, MD   Referring MD: Martinique, Betty G, MD   Chief Complaint  Patient presents with  . Palpitations    History of Present Illness:    Sonya Gilbert is a 69 y.o. female with a hx of h/o palpitations, shortness of breath, and chest pain.  There is background hypertension, CKD, Obesity, snoring and asthma.  Palpitations occur nocturnally when she is trying to fall off to sleep.  Recent prolonged monitor demonstrated frequent PACs.  No atrial fibrillation or any sustained arrhythmia of any sort was identified.  Past Medical History:  Diagnosis Date  . Allergy    seasonal  . Anemia    past hx   . Arthritis   . Asthma   . Chicken pox   . Chronic kidney disease   . Family history of colon cancer in father    age 67 and sister age 1   . GERD (gastroesophageal reflux disease)   . History of frequent urinary tract infections   . Hypertension   . Obesity   . Osteopenia   . Peptic ulcer    history of PUD    Past Surgical History:  Procedure Laterality Date  . ABDOMINAL HYSTERECTOMY  1996  . BREAST SURGERY  2014   biopsy  . COLONOSCOPY  2004   [facility]- normal   . TONSILLECTOMY     At 69 yrs old    Current Medications: Current Meds  Medication Sig  . albuterol (VENTOLIN HFA) 108 (90 Base) MCG/ACT inhaler Inhale 2 puffs into the lungs every 6 (six) hours as needed for wheezing or shortness of breath.  . Cholecalciferol (HM VITAMIN D3) 4000 units CAPS Take 4,000 Units by mouth daily.  Marland Kitchen loratadine (CLARITIN) 10 MG tablet Take 10 mg by mouth daily.  Marland Kitchen olmesartan-hydrochlorothiazide (BENICAR HCT) 20-12.5 MG tablet Take 1 tablet by mouth daily.   Current Facility-Administered Medications for the 07/25/19 encounter (Office Visit) with Belva Crome, MD  Medication  . 0.9 %  sodium chloride infusion      Allergies:   Codeine, Erythromycin, and Other   Social History   Socioeconomic History  . Marital status: Widowed    Spouse name: Not on file  . Number of children: Not on file  . Years of education: Not on file  . Highest education level: Not on file  Occupational History  . Not on file  Tobacco Use  . Smoking status: Never Smoker  . Smokeless tobacco: Never Used  Substance and Sexual Activity  . Alcohol use: No    Alcohol/week: 1.0 standard drinks    Types: 1 Glasses of wine per week    Comment: 1 glass wine Q 3-4 months   . Drug use: No  . Sexual activity: Not on file  Other Topics Concern  . Not on file  Social History Narrative  . Not on file   Social Determinants of Health   Financial Resource Strain:   . Difficulty of Paying Living Expenses: Not on file  Food Insecurity:   . Worried About Charity fundraiser in the Last Year: Not on file  . Ran Out of Food in the Last Year: Not on file  Transportation Needs:   . Lack of Transportation (Medical): Not on file  . Lack of Transportation (Non-Medical):  Not on file  Physical Activity:   . Days of Exercise per Week: Not on file  . Minutes of Exercise per Session: Not on file  Stress:   . Feeling of Stress : Not on file  Social Connections:   . Frequency of Communication with Friends and Family: Not on file  . Frequency of Social Gatherings with Friends and Family: Not on file  . Attends Religious Services: Not on file  . Active Member of Clubs or Organizations: Not on file  . Attends Banker Meetings: Not on file  . Marital Status: Not on file     Family History: The patient's family history includes Breast cancer in her sister; Cancer in her father, sister, and sister; Colon cancer (age of onset: 105) in her sister; Colon cancer (age of onset: 76) in her father; Colon polyps in her brother; Deep vein thrombosis in her mother and sister; Heart disease in her mother; Hypertension in her brother,  mother, and sister. There is no history of Esophageal cancer, Rectal cancer, or Stomach cancer.  ROS:   Please see the history of present illness.    Anxiety. Fear of Healthcare.  All other systems reviewed and are negative.  EKGs/Labs/Other Studies Reviewed:    The following studies were reviewed today: Continuous monitor 07/09/2019 Study Highlights   NSR  Frequent PAC's  No sustained SVT   2D Doppler echocardiogram: IMPRESSIONS    1. Technically difficult study, patient declined use of Definity. Left ventricular ejection fraction, by visual estimation, is grossly 50 to 55%. Images are not sufficient to assess for regional wall motion abnormalities. There is mildly increased left  ventricular hypertrophy.  2. Global right ventricle has normal systolic function.The right ventricular size is normal. No increase in right ventricular wall thickness.  3. Left atrial size was normal.  4. Right atrial size was normal.  5. Trivial pericardial effusion is present.  6. The mitral valve is normal in structure. No evidence of mitral valve regurgitation.  7. The tricuspid valve is not well visualized. Tricuspid valve regurgitation is trivial.  8. The aortic valve was not well visualized. Aortic valve regurgitation is not visualized.  9. The pulmonic valve was not well visualized. Pulmonic valve regurgitation is not visualized. 10. The inferior vena cava is normal in size with greater than 50% respiratory variability, suggesting right atrial pressure of 3 mmHg. 11. The tricuspid regurgitant velocity is 2.78 m/s, and with an assumed right atrial pressure of 3 mmHg, the estimated right ventricular systolic pressure is mildly elevated at 33.9 mmHg.  EKG:  EKG not repeated  Recent Labs: 04/24/2019: ALT 14; BUN 12; Creatinine, Ser 0.84; Potassium 3.9; Sodium 141  Recent Lipid Panel    Component Value Date/Time   CHOL 200 04/24/2019 1019   TRIG 72.0 04/24/2019 1019   HDL 80.70 04/24/2019  1019   CHOLHDL 2 04/24/2019 1019   VLDL 14.4 04/24/2019 1019   LDLCALC 105 (H) 04/24/2019 1019    Physical Exam:    VS:  BP 122/76   Pulse (!) 104   Ht 5\' 3"  (1.6 m)   Wt 287 lb 6.4 oz (130.4 kg)   SpO2 99%   BMI 50.91 kg/m     Wt Readings from Last 3 Encounters:  07/25/19 287 lb 6.4 oz (130.4 kg)  06/03/19 292 lb 6.4 oz (132.6 kg)  04/24/19 294 lb 6 oz (133.5 kg)     GEN: Morbidly obese. No acute distress HEENT: Normal NECK: No JVD. LYMPHATICS:  No lymphadenopathy CARDIAC:  RRR without murmur, gallop, or edema. VASCULAR:  Normal Pulses. No bruits. RESPIRATORY:  Clear to auscultation without rales, wheezing or rhonchi  ABDOMEN: Soft, non-tender, non-distended, No pulsatile mass, MUSCULOSKELETAL: No deformity  SKIN: Warm and dry NEUROLOGIC:  Alert and oriented x 3 PSYCHIATRIC:  Normal affect   ASSESSMENT:    1. Palpitations   2. SOB (shortness of breath)   3. Hypertension, essential, benign   4. Prediabetes   5. Mixed hyperlipidemia   6. Class 3 severe obesity due to excess calories with serious comorbidity and body mass index (BMI) of 45.0 to 49.9 in adult [facility])   7. Educated about COVID-19 virus infection    PLAN:    In order of problems listed above:  1. Based upon data from monitoring, palpitations were related to PACs.  Start low-dose metoprolol succinate 12.5 mg/day.  If this does not improve the complaint after 1 week, increase to 25 mg/day.  A selective beta-blocker is chosen because of background history of asthma.  57-month follow-up. 2. Not discussed is a particular problem. 3. Stable today with mild increase in resting heart rate of 104 bpm.   Medication Adjustments/Labs and Tests Ordered: Current medicines are reviewed at length with the patient today.  Concerns regarding medicines are outlined above.  No orders of the defined types were placed in this encounter.  Meds ordered this encounter  Medications  . metoprolol succinate (TOPROL XL) 25 MG  24 hr tablet    Sig: Take 0.5 tablets (12.5 mg total) by mouth daily.    Dispense:  45 tablet    Refill:  3    Patient Instructions  Medication Instructions:  1) START Metoprolol Succinate 12.5mg  once daily.  Please contact the office in a week and let us know how you are feeling.    *If you need a refill on your cardiac medications before your next appointment, please call your pharmacy*  Lab Work: None If you have labs (blood work) drawn today and your tests are completely normal, you will receive your results only by: Marland Kitchen MyChart Message (if you have MyChart) OR . A paper copy in the mail If you have any lab test that is abnormal or we need to change your treatment, we will call you to review the results.  Testing/Procedures: None  Follow-Up: At [facility], you and your health needs are our priority.  As part of our continuing mission to provide you with exceptional heart care, we have created designated Provider Care Teams.  These Care Teams include your primary Cardiologist (physician) and Advanced Practice Providers (APPs -  Physician Assistants and Nurse Practitioners) who all work together to provide you with the care you need, when you need it.  Your next appointment:   6 month(s)  The format for your next appointment:   In Person  Provider:   You may see Lesleigh Noe, MD or one of the following Advanced Practice Providers on your designated Care Team:    Norma Fredrickson, NP  Nada Boozer, NP  Georgie Chard, NP   Other Instructions      Signed, Lesleigh Noe, MD  07/25/2019 1:07 PM    [facility]

## 2019-07-25 ENCOUNTER — Ambulatory Visit (INDEPENDENT_AMBULATORY_CARE_PROVIDER_SITE_OTHER): Payer: BC Managed Care – PPO | Admitting: Interventional Cardiology

## 2019-07-25 ENCOUNTER — Other Ambulatory Visit: Payer: Self-pay

## 2019-07-25 ENCOUNTER — Encounter: Payer: Self-pay | Admitting: Interventional Cardiology

## 2019-07-25 VITALS — BP 122/76 | HR 104 | Ht 63.0 in | Wt 287.4 lb

## 2019-07-25 DIAGNOSIS — R002 Palpitations: Secondary | ICD-10-CM

## 2019-07-25 DIAGNOSIS — E782 Mixed hyperlipidemia: Secondary | ICD-10-CM

## 2019-07-25 DIAGNOSIS — Z7189 Other specified counseling: Secondary | ICD-10-CM

## 2019-07-25 DIAGNOSIS — Z6841 Body Mass Index (BMI) 40.0 and over, adult: Secondary | ICD-10-CM

## 2019-07-25 DIAGNOSIS — R0602 Shortness of breath: Secondary | ICD-10-CM

## 2019-07-25 DIAGNOSIS — I10 Essential (primary) hypertension: Secondary | ICD-10-CM

## 2019-07-25 DIAGNOSIS — R7303 Prediabetes: Secondary | ICD-10-CM | POA: Diagnosis not present

## 2019-07-25 MED ORDER — METOPROLOL SUCCINATE ER 25 MG PO TB24: 12.5000 mg | ORAL_TABLET | Freq: Every day | ORAL | 3 refills | Status: DC

## 2019-07-25 NOTE — Patient Instructions (Signed)
Medication Instructions:  1) START Metoprolol Succinate 12.5mg  once daily.  Please contact the office in a week and let us know how you are feeling.    *If you need a refill on your cardiac medications before your next appointment, please call your pharmacy*  Lab Work: None If you have labs (blood work) drawn today and your tests are completely normal, you will receive your results only by: Marland Kitchen MyChart Message (if you have MyChart) OR . A paper copy in the mail If you have any lab test that is abnormal or we need to change your treatment, we will call you to review the results.  Testing/Procedures: None  Follow-Up: At [facility], you and your health needs are our priority.  As part of our continuing mission to provide you with exceptional heart care, we have created designated Provider Care Teams.  These Care Teams include your primary Cardiologist (physician) and Advanced Practice Providers (APPs -  Physician Assistants and Nurse Practitioners) who all work together to provide you with the care you need, when you need it.  Your next appointment:   6 month(s)  The format for your next appointment:   In Person  Provider:   You may see Lesleigh Noe, MD or one of the following Advanced Practice Providers on your designated Care Team:    Norma Fredrickson, NP  Nada Boozer, NP  Georgie Chard, NP   Other Instructions

## 2019-08-15 ENCOUNTER — Telehealth: Payer: Self-pay | Admitting: Family Medicine

## 2019-08-15 NOTE — Telephone Encounter (Signed)
Pt called b/c her insurance is charging her a copay for her cpe on 04/24/19. Informed pt that I do not handle billing. Transferred her to billing Nothing further

## 2020-02-28 ENCOUNTER — Other Ambulatory Visit: Payer: BC Managed Care – PPO

## 2020-02-28 ENCOUNTER — Other Ambulatory Visit: Payer: Self-pay

## 2020-02-28 DIAGNOSIS — Z20822 Contact with and (suspected) exposure to covid-19: Secondary | ICD-10-CM

## 2020-02-29 LAB — SARS-COV-2, NAA 2 DAY TAT

## 2020-02-29 LAB — NOVEL CORONAVIRUS, NAA: SARS-CoV-2, NAA: NOT DETECTED

## 2020-03-16 ENCOUNTER — Other Ambulatory Visit: Payer: Self-pay | Admitting: Family Medicine

## 2020-03-16 DIAGNOSIS — I10 Essential (primary) hypertension: Secondary | ICD-10-CM

## 2020-03-16 DIAGNOSIS — J4520 Mild intermittent asthma, uncomplicated: Secondary | ICD-10-CM

## 2020-09-17 ENCOUNTER — Other Ambulatory Visit: Payer: Self-pay | Admitting: Interventional Cardiology

## 2020-10-21 NOTE — Progress Notes (Signed)
Cardiology Office Note:    Date:  10/22/2020   ID:  Sonya Gilbert, DOB [birth year], MRN [id number]  PCP:  [place], Betty G, MD  Cardiologist:  Lesleigh Noe, MD   Referring MD: [place], Betty G, MD   Chief Complaint  Patient presents with  . Palpitations    History of Present Illness:    Sonya Gilbert is a 70 y.o. female with a hx of palpitations, shortness of breath, and chest pain. There isbackground hypertension, CKD, Obesity, snoringand asthma.  Palpitations were evaluated with monitoring.  Please see the results below.  PACs and brief runs of SVT were noted.  She is not having chest pain.  She has some concerns about intermittent bilateral lower extremity swelling.  Her legs swell during the day.  She has difficulty sleeping.  Occasionally snoring will awaken her from sleep and she finds herself in a panic.  Her son has sleep apnea.  Past Medical History:  Diagnosis Date  . Allergy    seasonal  . Anemia    past hx   . Arthritis   . Asthma   . Chicken pox   . Chronic kidney disease   . Family history of colon cancer in father    age 66 and sister age 58   . GERD (gastroesophageal reflux disease)   . History of frequent urinary tract infections   . Hypertension   . Obesity   . Osteopenia   . Peptic ulcer    history of PUD    Past Surgical History:  Procedure Laterality Date  . ABDOMINAL HYSTERECTOMY  1996  . BREAST SURGERY  2014   biopsy  . COLONOSCOPY  2004   [facility]- normal   . TONSILLECTOMY     At 70 yrs old    Current Medications: Current Meds  Medication Sig  . albuterol (VENTOLIN HFA) 108 (90 Base) MCG/ACT inhaler Inhale 2 puffs into the lungs every 6 (six) hours as needed for wheezing or shortness of breath.  . Cholecalciferol 100 MCG (4000 UT) CAPS Take 4,000 Units by mouth daily.  Marland Kitchen loratadine (CLARITIN) 10 MG tablet Take 10 mg by mouth daily.  Marland Kitchen olmesartan-hydrochlorothiazide (BENICAR HCT) 20-12.5 MG tablet TAKE 1 TABLET BY  MOUTH EVERY DAY  . [DISCONTINUED] metoprolol succinate (TOPROL-XL) 25 MG 24 hr tablet TAKE 1/2 TABLET BY MOUTH EVERY DAY   Current Facility-Administered Medications for the 10/22/20 encounter (Office Visit) with Lyn Records, MD  Medication  . 0.9 %  sodium chloride infusion     Allergies:   Codeine, Erythromycin, and Other   Social History   Socioeconomic History  . Marital status: Widowed    Spouse name: Not on file  . Number of children: Not on file  . Years of education: Not on file  . Highest education level: Not on file  Occupational History  . Not on file  Tobacco Use  . Smoking status: Never Smoker  . Smokeless tobacco: Never Used  Substance and Sexual Activity  . Alcohol use: No    Alcohol/week: 1.0 standard drink    Types: 1 Glasses of wine per week    Comment: 1 glass wine Q 3-4 months   . Drug use: No  . Sexual activity: Not on file  Other Topics Concern  . Not on file  Social History Narrative  . Not on file   Social Determinants of Health   Financial Resource Strain: Not on file  Food Insecurity: Not on file  Transportation Needs: Not on file  Physical Activity: Not on file  Stress: Not on file  Social Connections: Not on file     Family History: The patient's family history includes Breast cancer in her sister; Cancer in her father, sister, and sister; Colon cancer (age of onset: 8) in her sister; Colon cancer (age of onset: 1) in her father; Colon polyps in her brother; Deep vein thrombosis in her mother and sister; Heart disease in her mother; Hypertension in her brother, mother, and sister. There is no history of Esophageal cancer, Rectal cancer, or Stomach cancer.  ROS:   Please see the history of present illness.    Difficulty sleeping.  Early awakening.  Snoring.  Obese.  Previous evaluation suggested venous insufficiency in the lower extremities.  All other systems reviewed and are negative.  EKGs/Labs/Other Studies Reviewed:    The  following studies were reviewed today:  Continuous monitor 07/09/2019: Study Highlights   NSR  Frequent PAC's  No sustained SVT  Lower extremity arterial Doppler 2014.  Right ABI 0.83; left ABI 0.92  EKG:  EKG normal sinus rhythm, PACs, poor R wave progression V1 and V2, otherwise normal.  Recent Labs: No results found for requested labs within last 8760 hours.  Recent Lipid Panel    Component Value Date/Time   CHOL 200 04/24/2019 1019   TRIG 72.0 04/24/2019 1019   HDL 80.70 04/24/2019 1019   CHOLHDL 2 04/24/2019 1019   VLDL 14.4 04/24/2019 1019   LDLCALC 105 (H) 04/24/2019 1019    Physical Exam:    VS:  BP 126/72   Pulse 92   Ht 5\' 3"  (1.6 m)   Wt 297 lb (134.7 kg)   SpO2 97%   BMI 52.61 kg/m     Wt Readings from Last 3 Encounters:  10/22/20 297 lb (134.7 kg)  07/25/19 287 lb 6.4 oz (130.4 kg)  06/03/19 292 lb 6.4 oz (132.6 kg)     GEN: Morbid obesity. No acute distress HEENT: Normal NECK: No JVD. LYMPHATICS: No lymphadenopathy CARDIAC: No murmur. RRR no gallop, or edema. VASCULAR: No normal Pulses. No bruits. RESPIRATORY:  Clear to auscultation without rales, wheezing or rhonchi  ABDOMEN: Soft, non-tender, non-distended, No pulsatile mass, MUSCULOSKELETAL: No deformity  SKIN: Warm and dry NEUROLOGIC:  Alert and oriented x 3 PSYCHIATRIC:  Normal affect   ASSESSMENT:    1. Hypertension, essential, benign   2. Prediabetes   3. Mixed hyperlipidemia   4. Class 3 severe obesity due to excess calories with serious comorbidity and body mass index (BMI) of 45.0 to 49.9 in adult [facility])   5. Educated about COVID-19 virus infection   6. Snoring   7. Apnea    PLAN:    In order of problems listed above:  1. Pressure is controlled based upon recordings with a large cuff.  Smaller cuff recordings demonstrate higher blood pressures.  2 g sodium diet, weight loss, physical activity encouraged.  Toprol-XL is being increased to 25 mg/day for better control of  PACs.  Hopefully this will improve blood pressure. 2. Primary prevention of symptomatic vascular disease is discussed.  Physical activity, decrease caloric intake, decrease carbohydrate intake, all important. 3. Discussed Mediterranean diet. 4. Weight loss discussed. 5. Palpitations have been demonstrated in the past be related to PACs and brief PSVT.  Improved since starting ultra low-dose Toprol-XL 12.5 mg/day.  Increasing to 25 mg/day.  EKG today demonstrates frequent PACs. 6. Multiple symptoms and phenotype for obstructive sleep apnea.  Also  lower extremity swelling, frequent atrial arrhythmias, hypertension all within the realm of being aggravated by obstructive sleep apnea.  A sleep study is ordered.  Overall education and awareness concerning primary risk prevention was discussed in detail: LDL less than 70, hemoglobin A1c less than 7, blood pressure target less than 130/80 mmHg, >150 minutes of moderate aerobic activity per week, avoidance of smoking, weight control (via diet and exercise), and continued surveillance/management of/for obstructive sleep apnea.   Plan 1 year follow-up or earlier depending upon response to increase metoprolol.   Medication Adjustments/Labs and Tests Ordered: Current medicines are reviewed at length with the patient today.  Concerns regarding medicines are outlined above.  Orders Placed This Encounter  Procedures  . EKG 12-Lead  . Split night study   Meds ordered this encounter  Medications  . metoprolol succinate (TOPROL-XL) 25 MG 24 hr tablet    Sig: Take 1 tablet (25 mg total) by mouth daily.    Dispense:  90 tablet    Refill:  3    Dose change    Patient Instructions  Medication Instructions:  1) INCREASE Metoprolol Succinate to 25mg  once daily  *If you need a refill on your cardiac medications before your next appointment, please call your pharmacy*   Lab Work: None If you have labs (blood work) drawn today and your tests are completely  normal, you will receive your results only by: MyChart Message (if you have MyChart) OR . A paper copy in the mail If you have any lab test that is abnormal or we need to change your treatment, we will call you to review the results.   Testing/Procedures: Your physician has recommended that you have a sleep study. This test records several body functions during sleep, including: brain activity, eye movement, oxygen and carbon dioxide blood levels, heart rate and rhythm, breathing rate and rhythm, the flow of air through your mouth and nose, snoring, body muscle movements, and chest and belly movement.   Follow-Up: At [facility], you and your health needs are our priority.  As part of our continuing mission to provide you with exceptional heart care, we have created designated Provider Care Teams.  These Care Teams include your primary Cardiologist (physician) and Advanced Practice Providers (APPs -  Physician Assistants and Nurse Practitioners) who all work together to provide you with the care you need, when you need it.  We recommend signing up for the patient portal called "MyChart".  Sign up information is provided on this After Visit Summary.  MyChart is used to connect with patients for Virtual Visits (Telemedicine).  Patients are able to view lab/test results, encounter notes, upcoming appointments, etc.  Non-urgent messages can be sent to your provider as well.   To learn more about what you can do with MyChart, go to [facility].    Your next appointment:   1 year(s)  The format for your next appointment:   In Person  Provider:   You may see [email or web address], MD or one of the following Advanced Practice Providers on your designated Care Team:    Lesleigh Noe, NP    Other Instructions      Signed, Georgie Chard, MD  10/22/2020 10:02 AM     [facility]

## 2020-10-22 ENCOUNTER — Ambulatory Visit (INDEPENDENT_AMBULATORY_CARE_PROVIDER_SITE_OTHER): Payer: BC Managed Care – PPO | Admitting: Interventional Cardiology

## 2020-10-22 ENCOUNTER — Other Ambulatory Visit: Payer: Self-pay

## 2020-10-22 ENCOUNTER — Encounter: Payer: Self-pay | Admitting: Interventional Cardiology

## 2020-10-22 VITALS — BP 126/72 | HR 92 | Ht 63.0 in | Wt 297.0 lb

## 2020-10-22 DIAGNOSIS — I10 Essential (primary) hypertension: Secondary | ICD-10-CM | POA: Diagnosis not present

## 2020-10-22 DIAGNOSIS — R0683 Snoring: Secondary | ICD-10-CM

## 2020-10-22 DIAGNOSIS — R7303 Prediabetes: Secondary | ICD-10-CM

## 2020-10-22 DIAGNOSIS — Z7189 Other specified counseling: Secondary | ICD-10-CM

## 2020-10-22 DIAGNOSIS — Z6841 Body Mass Index (BMI) 40.0 and over, adult: Secondary | ICD-10-CM

## 2020-10-22 DIAGNOSIS — R0681 Apnea, not elsewhere classified: Secondary | ICD-10-CM

## 2020-10-22 DIAGNOSIS — E782 Mixed hyperlipidemia: Secondary | ICD-10-CM

## 2020-10-22 MED ORDER — METOPROLOL SUCCINATE ER 25 MG PO TB24: 25.0000 mg | ORAL_TABLET | Freq: Every day | ORAL | 3 refills | Status: DC

## 2020-10-22 NOTE — Patient Instructions (Signed)
Medication Instructions:  1) INCREASE Metoprolol Succinate to 25mg  once daily  *If you need a refill on your cardiac medications before your next appointment, please call your pharmacy*   Lab Work: None If you have labs (blood work) drawn today and your tests are completely normal, you will receive your results only by: MyChart Message (if you have MyChart) OR . A paper copy in the mail If you have any lab test that is abnormal or we need to change your treatment, we will call you to review the results.   Testing/Procedures: Your physician has recommended that you have a sleep study. This test records several body functions during sleep, including: brain activity, eye movement, oxygen and carbon dioxide blood levels, heart rate and rhythm, breathing rate and rhythm, the flow of air through your mouth and nose, snoring, body muscle movements, and chest and belly movement.   Follow-Up: At [facility], you and your health needs are our priority.  As part of our continuing mission to provide you with exceptional heart care, we have created designated Provider Care Teams.  These Care Teams include your primary Cardiologist (physician) and Advanced Practice Providers (APPs -  Physician Assistants and Nurse Practitioners) who all work together to provide you with the care you need, when you need it.  We recommend signing up for the patient portal called "MyChart".  Sign up information is provided on this After Visit Summary.  MyChart is used to connect with patients for Virtual Visits (Telemedicine).  Patients are able to view lab/test results, encounter notes, upcoming appointments, etc.  Non-urgent messages can be sent to your provider as well.   To learn more about what you can do with MyChart, go to [facility].    Your next appointment:   1 year(s)  The format for your next appointment:   In Person  Provider:   You may see [email or web address], MD or one of the following  Advanced Practice Providers on your designated Care Team:    Lesleigh Noe, NP    Other Instructions

## 2020-10-23 ENCOUNTER — Telehealth: Payer: Self-pay | Admitting: *Deleted

## 2020-10-23 DIAGNOSIS — R0683 Snoring: Secondary | ICD-10-CM

## 2020-10-23 DIAGNOSIS — I10 Essential (primary) hypertension: Secondary | ICD-10-CM

## 2020-10-23 DIAGNOSIS — R0681 Apnea, not elsewhere classified: Secondary | ICD-10-CM

## 2020-10-23 NOTE — Telephone Encounter (Signed)
-----   Message from Julio Sicks, RN sent at 10/22/2020 10:00 AM EDT ----- Sleep study ordered

## 2020-10-28 ENCOUNTER — Other Ambulatory Visit: Payer: Self-pay

## 2020-10-28 ENCOUNTER — Encounter: Payer: Self-pay | Admitting: Family Medicine

## 2020-10-28 ENCOUNTER — Ambulatory Visit (INDEPENDENT_AMBULATORY_CARE_PROVIDER_SITE_OTHER): Payer: BC Managed Care – PPO | Admitting: Family Medicine

## 2020-10-28 VITALS — BP 130/80 | HR 89 | Resp 16 | Ht 63.0 in | Wt 299.0 lb

## 2020-10-28 DIAGNOSIS — Z130 Encounter for screening for diseases of the blood and blood-forming organs and certain disorders involving the immune mechanism: Secondary | ICD-10-CM | POA: Diagnosis not present

## 2020-10-28 DIAGNOSIS — E782 Mixed hyperlipidemia: Secondary | ICD-10-CM

## 2020-10-28 DIAGNOSIS — Z13228 Encounter for screening for other metabolic disorders: Secondary | ICD-10-CM | POA: Diagnosis not present

## 2020-10-28 DIAGNOSIS — Z1329 Encounter for screening for other suspected endocrine disorder: Secondary | ICD-10-CM | POA: Diagnosis not present

## 2020-10-28 DIAGNOSIS — Z780 Asymptomatic menopausal state: Secondary | ICD-10-CM | POA: Diagnosis not present

## 2020-10-28 DIAGNOSIS — Z0000 Encounter for general adult medical examination without abnormal findings: Secondary | ICD-10-CM

## 2020-10-28 DIAGNOSIS — J4520 Mild intermittent asthma, uncomplicated: Secondary | ICD-10-CM

## 2020-10-28 DIAGNOSIS — E559 Vitamin D deficiency, unspecified: Secondary | ICD-10-CM

## 2020-10-28 LAB — COMPREHENSIVE METABOLIC PANEL
ALT: 11 U/L (ref 0–35)
AST: 12 U/L (ref 0–37)
Albumin: 3.7 g/dL (ref 3.5–5.2)
Alkaline Phosphatase: 83 U/L (ref 39–117)
BUN: 15 mg/dL (ref 6–23)
CO2: 30 mEq/L (ref 19–32)
Calcium: 9.1 mg/dL (ref 8.4–10.5)
Chloride: 104 mEq/L (ref 96–112)
Creatinine, Ser: 0.83 mg/dL (ref 0.40–1.20)
GFR: 72.85 mL/min (ref >60.00)
Glucose, Bld: 70 mg/dL (ref 70–99)
Potassium: 3.8 mEq/L (ref 3.5–5.1)
Sodium: 142 mEq/L (ref 135–145)
Total Bilirubin: 0.7 mg/dL (ref 0.2–1.2)
Total Protein: 7.2 g/dL (ref 6.0–8.3)

## 2020-10-28 LAB — VITAMIN D 25 HYDROXY (VIT D DEFICIENCY, FRACTURES): VITD: 33.01 ng/mL (ref 30.00–100.00)

## 2020-10-28 LAB — LIPID PANEL
Cholesterol: 185 mg/dL (ref 0–200)
HDL: 72.1 mg/dL (ref >39.00)
LDL Cholesterol: 100 mg/dL — ABNORMAL HIGH (ref 0–99)
NonHDL: 112.67
Total CHOL/HDL Ratio: 3
Triglycerides: 65 mg/dL (ref 0.0–149.0)
VLDL: 13 mg/dL (ref 0.0–40.0)

## 2020-10-28 LAB — HEMOGLOBIN A1C: Hgb A1c MFr Bld: 5.4 % (ref 4.6–6.5)

## 2020-10-28 MED ORDER — ALBUTEROL SULFATE HFA 108 (90 BASE) MCG/ACT IN AERS: 2.0000 | INHALATION_SPRAY | Freq: Four times a day (QID) | RESPIRATORY_TRACT | 3 refills | Status: DC | PRN

## 2020-10-28 MED ORDER — FLUTICASONE-SALMETEROL 250-50 MCG/DOSE IN AEPB: 1.0000 | INHALATION_SPRAY | Freq: Two times a day (BID) | RESPIRATORY_TRACT | 3 refills | Status: DC

## 2020-10-28 NOTE — Patient Instructions (Addendum)
Today you have you routine preventive visit. A few things to remember from today's visit:   Routine general medical examination at a health care facility  Vitamin D deficiency, unspecified - Plan: VITAMIN D 25 Hydroxy (Vit-D Deficiency, Fractures), Comprehensive metabolic panel  Mixed hyperlipidemia - Plan: Lipid panel  Asthma in adult, mild intermittent, uncomplicated - Plan: albuterol (VENTOLIN HFA) 108 (90 Base) MCG/ACT inhaler  Asymptomatic postmenopausal estrogen deficiency - Plan: DEXAScan  Screening for endocrine, metabolic and immunity disorder - Plan: Comprehensive metabolic panel, Hemoglobin A1c  If you need refills please call your pharmacy. Do not use My Chart to request refills or for acute issues that need immediate attention.    Please be sure medication list is accurate. If a new problem present, please set up appointment sooner than planned today.  At least 150 minutes of moderate exercise per week, daily brisk walking for 15-30 min is a good exercise option. Healthy diet low in saturated (animal) fats and sweets and consisting of fresh fruits and vegetables, lean meats such as fish and white chicken and whole grains.  These are some of recommendations for screening depending of age and risk factors:  - Vaccines:  Tdap vaccine every 10 years.  Shingles vaccine recommended at age 51, could be given after 68 years of age but not sure about insurance coverage.   Pneumonia vaccines: Pneumovax at 65. Sometimes Pneumovax is giving earlier if history of smoking, lung disease,diabetes,kidney disease among some.  Screening for diabetes at age 30 and every 3 years.  Cervical cancer prevention:  Pap smear starts at 68 years of age and continues periodically until 68 years old in low risk women. Pap smear every 3 years between 10 and 34 years old. Pap smear every 3-5 years between women 30 and older if pap smear negative and HPV screening negative.   -Breast cancer:  Mammogram: There is disagreement between experts about when to start screening in low risk asymptomatic female but recent recommendations are to start screening at 43 and not later than 68 years old , every 1-2 years and after 68 yo q 2 years. Screening is recommended until 68 years old but some women can continue screening depending of healthy issues. Please arrange your mammogram.  Colon cancer screening: Has been recently changed to 68 yo. Insurance may not cover until you are 68 years old. Screening is recommended until 68 years old.  Cholesterol disorder screening at age 63 and every 3 years.N/A  Also recommended:  1. Dental visit- Brush and floss your teeth twice daily; visit your dentist twice a year. 2. Eye doctor- Get an eye exam at least every 2 years. 3. Helmet use- Always wear a helmet when riding a bicycle, motorcycle, rollerblading or skateboarding. 4. Safe sex- If you may be exposed to sexually transmitted infections, use a condom. 5. Seat belts- Seat belts can save your live; always wear one. 6. Smoke/Carbon Monoxide detectors- These detectors need to be installed on the appropriate level of your home. Replace batteries at least once a year. 7. Skin cancer- When out in the sun please cover up and use sunscreen 15 SPF or higher. 8. Violence- If anyone is threatening or hurting you, please tell your healthcare provider.  9. Drink alcohol in moderation- Limit alcohol intake to one drink or less per day. Never drink and drive. 10. Calcium supplementation 1000 to 1200 mg daily, ideally through your diet.  Vitamin D supplementation 800 units daily.

## 2020-10-28 NOTE — Progress Notes (Signed)
HPI: Sonya Gilbert is a 70 y.o. female, who is here today for her routine physical.  Last CPE: 04/24/19  Regular exercise 3 or more time per week: She is not doing so regularly. Following a healthful diet: She is trying to eat more salads and fruits. Decreased salt intake and no longer snacking on cookies. She is not eating out as much as she did, cooking more at home. She lives alone. She has family that lives close by.  Chronic medical problems: Vitamin D deficiency, OSA, hypertension, hyperlipidemia, and asthma among some.  Immunization History  Administered Date(s) Administered  . Influenza Inj Mdck Quad Pf 08/16/2016, 05/15/2017  . Influenza,inj,Quad PF,6+ Mos 04/24/2019  . Influenza-Unspecified 08/16/2016, 05/15/2017  . Pneumococcal Polysaccharide-23 04/24/2019   Mammogram: Overdue.Planning on scheduling appt. Colonoscopy: 08/02/2017, 5 years f/u recommended. DEXA: Does not remember having one. Hep C screening: 05/30/17 NR.  She has no new concerns today.  Vit D deficiency:Vit D 2000 U daily.  HTN: She is on olmesartan-HCTZ 20-12.5 mg daily and metoprolol succinate 25 mg daily. Recently saw her cardiologist, Dr Katrinka BlazingSmith, Metoprolol succinate dose was increased to better controlled PAC's. She was concerned about peri ankle edema, it has improved.  HLD: She is on nonpharmacologic treatment.  Lab Results  Component Value Date   CHOL 200 04/24/2019   HDL 80.70 04/24/2019   LDLCALC 105 (H) 04/24/2019   TRIG 72.0 04/24/2019   CHOLHDL 2 04/24/2019   Asthma: "Sometiems" mils SOB and wheezing with seasonal changes and exertion. Using Albuterol up to 2 times per week. Nocturnal symptoms. Symptoms ae more frequent during Spring and Fall.  Negative for orthopnea or PND.  Review of Systems  Constitutional: Negative for appetite change and fever.  HENT: Negative for hearing loss, mouth sores and sore throat.   Eyes: Negative for redness and visual disturbance.   Respiratory: Positive for wheezing. Negative for cough.   Cardiovascular: Positive for palpitations. Negative for chest pain.  Gastrointestinal: Negative for abdominal pain, nausea and vomiting.       No changes in bowel habits.  Endocrine: Negative for cold intolerance, heat intolerance, polydipsia, polyphagia and polyuria.  Genitourinary: Negative for decreased urine volume, dysuria, hematuria, vaginal bleeding and vaginal discharge.  Musculoskeletal: Negative for gait problem and myalgias.  Skin: Negative for color change and rash.  Allergic/Immunologic: Positive for environmental allergies.  Neurological: Negative for syncope, weakness and headaches.  Hematological: Negative for adenopathy. Does not bruise/bleed easily.  Psychiatric/Behavioral: Negative for behavioral problems and confusion.  All other systems reviewed and are negative.  Current Outpatient Medications on File Prior to Visit  Medication Sig Dispense Refill  . Cholecalciferol 100 MCG (4000 UT) CAPS Take 2,000 Units by mouth daily.    Marland Kitchen. loratadine (CLARITIN) 10 MG tablet Take 10 mg by mouth daily.    . metoprolol succinate (TOPROL-XL) 25 MG 24 hr tablet Take 1 tablet (25 mg total) by mouth daily. 90 tablet 3  . olmesartan-hydrochlorothiazide (BENICAR HCT) 20-12.5 MG tablet TAKE 1 TABLET BY MOUTH EVERY DAY 90 tablet 2   Current Facility-Administered Medications on File Prior to Visit  Medication Dose Route Frequency Provider Last Rate Last Admin  . 0.9 %  sodium chloride infusion  500 mL Intravenous Once Pyrtle, Carie CaddyJay M, MD       Past Medical History:  Diagnosis Date  . Allergy    seasonal  . Anemia    past hx   . Arthritis   . Asthma   . Chicken pox   .  Chronic kidney disease   . Family history of colon cancer in father    age 67 and sister age 45   . GERD (gastroesophageal reflux disease)   . History of frequent urinary tract infections   . Hypertension   . Obesity   . Osteopenia   . Peptic ulcer     history of PUD    Past Surgical History:  Procedure Laterality Date  . ABDOMINAL HYSTERECTOMY  1996  . BREAST SURGERY  2014   biopsy  . COLONOSCOPY  2004   [facility]- normal   . TONSILLECTOMY     At 70 yrs old    Allergies  Allergen Reactions  . Codeine Palpitations  . Erythromycin Hives and Nausea And Vomiting  . Other     Bees, mold, grass, cats    Family History  Problem Relation Age of Onset  . Cancer Sister        breast  . Hypertension Sister   . Deep vein thrombosis Sister   . Breast cancer Sister   . Heart disease Mother        CHF  . Hypertension Mother   . Deep vein thrombosis Mother   . Cancer Father        Colon  . Colon cancer Father 49  . Cancer Sister        Colon  . Colon cancer Sister 22  . Hypertension Brother   . Colon polyps Brother   . Esophageal cancer Neg Hx   . Rectal cancer Neg Hx   . Stomach cancer Neg Hx     Social History   Socioeconomic History  . Marital status: Widowed    Spouse name: Not on file  . Number of children: Not on file  . Years of education: Not on file  . Highest education level: Not on file  Occupational History  . Not on file  Tobacco Use  . Smoking status: Never Smoker  . Smokeless tobacco: Never Used  Substance and Sexual Activity  . Alcohol use: No    Alcohol/week: 1.0 standard drink    Types: 1 Glasses of wine per week    Comment: 1 glass wine Q 3-4 months   . Drug use: No  . Sexual activity: Not on file  Other Topics Concern  . Not on file  Social History Narrative  . Not on file   Social Determinants of Health   Financial Resource Strain: Not on file  Food Insecurity: Not on file  Transportation Needs: Not on file  Physical Activity: Not on file  Stress: Not on file  Social Connections: Not on file   Vitals:   10/28/20 1029  BP: 130/80  Pulse: 89  Resp: 16  SpO2: 97%   Body mass index is 52.97 kg/m.  Wt Readings from Last 3 Encounters:  10/28/20 299 lb (135.6 kg)   10/22/20 297 lb (134.7 kg)  07/25/19 287 lb 6.4 oz (130.4 kg)   Physical Exam Vitals and nursing note reviewed. Exam conducted with a chaperone present.  Constitutional:      General: She is not in acute distress.    Appearance: She is well-developed.  HENT:     Head: Normocephalic and atraumatic.     Right Ear: Hearing, tympanic membrane, ear canal and external ear normal.     Left Ear: Hearing, tympanic membrane, ear canal and external ear normal.     Mouth/Throat:     Mouth: Mucous membranes are moist.  Pharynx: Oropharynx is clear. Uvula midline.  Eyes:     Conjunctiva/sclera: Conjunctivae normal.     Pupils: Pupils are equal, round, and reactive to light.  Neck:     Thyroid: No thyromegaly.     Trachea: No tracheal deviation.  Cardiovascular:     Rate and Rhythm: Normal rate. Rhythm irregular. Occasional extrasystoles are present.    Pulses:          Dorsalis pedis pulses are 2+ on the right side and 2+ on the left side.     Heart sounds: No murmur heard.   Pulmonary:     Effort: Pulmonary effort is normal. No respiratory distress.     Breath sounds: Normal breath sounds.  Chest:  Breasts:     Right: No axillary adenopathy.     Left: No axillary adenopathy.    Abdominal:     Palpations: Abdomen is soft. There is no hepatomegaly or mass.     Tenderness: There is no abdominal tenderness.  Genitourinary:    Comments: Breast: No masses,skin changes, or nipple discharge bilateral. Musculoskeletal:     Comments: No signs of synovitis appreciated.  Lymphadenopathy:     Cervical: No cervical adenopathy.     Upper Body:     Right upper body: No axillary adenopathy.     Left upper body: No axillary adenopathy.  Skin:    General: Skin is warm.     Findings: No erythema or rash.  Neurological:     Mental Status: She is alert and oriented to person, place, and time.     Cranial Nerves: No cranial nerve deficit.     Coordination: Coordination normal.     Gait: Gait  normal.     Deep Tendon Reflexes:     Reflex Scores:      Bicep reflexes are 2+ on the right side and 2+ on the left side.      Patellar reflexes are 2+ on the right side and 2+ on the left side. Psychiatric:        Speech: Speech normal.     Comments: Well groomed, good eye contact.   ASSESSMENT AND PLAN:  Ms. LADELL BEY was here today annual physical examination.  Orders Placed This Encounter  Procedures  . DEXAScan  . VITAMIN D 25 Hydroxy (Vit-D Deficiency, Fractures)  . Comprehensive metabolic panel  . Lipid panel  . Hemoglobin A1c   Lab Results  Component Value Date   HGBA1C 5.4 10/28/2020   Lab Results  Component Value Date   CHOL 185 10/28/2020   HDL 72.10 10/28/2020   LDLCALC 100 (H) 10/28/2020   TRIG 65.0 10/28/2020   CHOLHDL 3 10/28/2020   Lab Results  Component Value Date   ALT 11 10/28/2020   AST 12 10/28/2020   ALKPHOS 83 10/28/2020   BILITOT 0.7 10/28/2020   Lab Results  Component Value Date   CREATININE 0.83 10/28/2020   BUN 15 10/28/2020   NA 142 10/28/2020   K 3.8 10/28/2020   CL 104 10/28/2020   CO2 30 10/28/2020   Routine general medical examination at a health care facility We discussed the importance of regular physical activity and healthy diet for prevention of chronic illness and/or complications. Preventive guidelines reviewed. Vaccination up to date. She will schedule her mammogram appt. Ca++ and vit D supplementation recommended. Next CPE in a year.  The 10-year ASCVD risk score Denman George DC Jr., et al., 2013) is: 9.6%   Values used to calculate the  score:     Age: 83 years     Sex: Female     Is Non-Hispanic African American: Yes     Diabetic: No     Tobacco smoker: No     Systolic Blood Pressure: 130 mmHg     Is BP treated: Yes     HDL Cholesterol: 72.1 mg/dL     Total Cholesterol: 185 mg/dL  Vitamin D deficiency, unspecified Continue vit D 2000 U daily. Further recommendations according to 25 OH vit D  result.  Mixed hyperlipidemia Continue non pharmacologic treatment. Further recommendations according to 10 years CVD score.  Asthma in adult, mild intermittent, uncomplicated Problem is not well controlled. Advair 250-50 mcg bid added today. No changes in Albuterol inh dose. Wt loss will help.  -     albuterol (VENTOLIN HFA) 108 (90 Base) MCG/ACT inhaler; Inhale 2 puffs into the lungs every 6 (six) hours as needed for wheezing or shortness of breath. -     Fluticasone-Salmeterol (ADVAIR DISKUS) 250-50 MCG/DOSE AEPB; Inhale 1 puff into the lungs 2 (two) times daily.  Asymptomatic postmenopausal estrogen deficiency -     DEXAScan; Future  Screening for endocrine, metabolic and immunity disorder -     Hemoglobin A1c -     Comprehensive metabolic panel   Return in 6 months (on 04/29/2021) for HTN,wt,asthma.   Shirla Hodgkiss G. [place], MD  [facility]. [place] [facility].   Today you have you routine preventive visit. A few things to remember from today's visit:   Routine general medical examination at a health care facility  Vitamin D deficiency, unspecified - Plan: VITAMIN D 25 Hydroxy (Vit-D Deficiency, Fractures), Comprehensive metabolic panel  Mixed hyperlipidemia - Plan: Lipid panel  Asthma in adult, mild intermittent, uncomplicated - Plan: albuterol (VENTOLIN HFA) 108 (90 Base) MCG/ACT inhaler  Asymptomatic postmenopausal estrogen deficiency - Plan: DEXAScan  Screening for endocrine, metabolic and immunity disorder - Plan: Comprehensive metabolic panel, Hemoglobin A1c  If you need refills please call your pharmacy. Do not use My Chart to request refills or for acute issues that need immediate attention.    Please be sure medication list is accurate. If a new problem present, please set up appointment sooner than planned today.  At least 150 minutes of moderate exercise per week, daily brisk walking for 15-30 min is a good exercise option. Healthy diet low in  saturated (animal) fats and sweets and consisting of fresh fruits and vegetables, lean meats such as fish and white chicken and whole grains.  These are some of recommendations for screening depending of age and risk factors:  - Vaccines:  Tdap vaccine every 10 years.  Shingles vaccine recommended at age 61, could be given after 68 years of age but not sure about insurance coverage.   Pneumonia vaccines: Pneumovax at 65. Sometimes Pneumovax is giving earlier if history of smoking, lung disease,diabetes,kidney disease among some.  Screening for diabetes at age 75 and every 3 years.  Cervical cancer prevention:  Pap smear starts at 68 years of age and continues periodically until 68 years old in low risk women. Pap smear every 3 years between 22 and 18 years old. Pap smear every 3-5 years between women 30 and older if pap smear negative and HPV screening negative.   -Breast cancer: Mammogram: There is disagreement between experts about when to start screening in low risk asymptomatic female but recent recommendations are to start screening at 38 and not later than 68 years old , every 1-2  years and after 68 yo q 2 years. Screening is recommended until 68 years old but some women can continue screening depending of healthy issues. Please arrange your mammogram.  Colon cancer screening: Has been recently changed to 68 yo. Insurance may not cover until you are 68 years old. Screening is recommended until 68 years old.  Cholesterol disorder screening at age 41 and every 3 years.N/A  Also recommended:  1. Dental visit- Brush and floss your teeth twice daily; visit your dentist twice a year. 2. Eye doctor- Get an eye exam at least every 2 years. 3. Helmet use- Always wear a helmet when riding a bicycle, motorcycle, rollerblading or skateboarding. 4. Safe sex- If you may be exposed to sexually transmitted infections, use a condom. 5. Seat belts- Seat belts can save your live; always wear  one. 6. Smoke/Carbon Monoxide detectors- These detectors need to be installed on the appropriate level of your home. Replace batteries at least once a year. 7. Skin cancer- When out in the sun please cover up and use sunscreen 15 SPF or higher. 8. Violence- If anyone is threatening or hurting you, please tell your healthcare provider.  9. Drink alcohol in moderation- Limit alcohol intake to one drink or less per day. Never drink and drive. 10. Calcium supplementation 1000 to 1200 mg daily, ideally through your diet.  Vitamin D supplementation 800 units daily.

## 2020-12-16 ENCOUNTER — Other Ambulatory Visit: Payer: Self-pay | Admitting: Interventional Cardiology

## 2020-12-23 ENCOUNTER — Telehealth: Payer: Self-pay | Admitting: *Deleted

## 2020-12-23 ENCOUNTER — Other Ambulatory Visit: Payer: Self-pay | Admitting: *Deleted

## 2020-12-23 DIAGNOSIS — R0683 Snoring: Secondary | ICD-10-CM

## 2020-12-23 DIAGNOSIS — R0681 Apnea, not elsewhere classified: Secondary | ICD-10-CM

## 2020-12-23 NOTE — Telephone Encounter (Signed)
Left message for pt to call back and ask to s/w Sonya Gilbert with Sleep Studies. See previous notes. I will have pt come to office for a quick 10-15 minutes and get her set up with [facility] Sleep Study. Insurance denied lab sleep study but did approve for [facility] home sleep study. Will await to hear from the pt before registering sleep study.

## 2020-12-23 NOTE — Telephone Encounter (Signed)
[employer] [place] denied in lab sleep study. Approved Itamar study. Order # [id number]. Valid dates 12/23/20 to 02/20/21. Danielle Rankin and Coralee North notified ok to order itamar.

## 2020-12-23 NOTE — Telephone Encounter (Signed)
-----   Message from Julio Sicks, RN sent at 12/16/2020 10:53 AM EDT ----- Anymore updates?  I've never seen precert take this long.  ----- Message ----- From: Reesa Chew, CMA Sent: 11/26/2020   3:51 PM EDT To: Julio Sicks, RN  In precert still ----- Message ----- From: Julio Sicks, RN Sent: 11/26/2020  11:42 AM EDT To: Reesa Chew, CMA  Any updates on getting this scheduled?   ----- Message ----- From: Julio Sicks, RN Sent: 10/22/2020  10:01 AM EDT To: Reesa Chew, CMA, [facility]  Sleep study ordered

## 2020-12-24 NOTE — Telephone Encounter (Signed)
Left message for pt to call back to discuss sleep study, see notes from yesterday.

## 2020-12-25 NOTE — Telephone Encounter (Signed)
Patient's insurance denied  in lab study. Did approve Itamar HST. Please have Coralee North and Okey Regal get this ordered.  [employer] order # [id number]. Valid dates 12/23/20 to 02/20/21.

## 2020-12-28 NOTE — Telephone Encounter (Signed)
I was able to reach the pt today and discussed with her about the Sleep study for in lab was denied, though was approved for [facility] Sleep Study (in home sleep study). Pt is agreeable. I explained that I will need for her to come by the office for a quick 10-15 minutes to set up the sleep with her and go over instructions. Since sleep study Donnie Coffin has been approved already I will be able to give the pt the PIN # [id number]. Pt will come 6/16 @ 2 pm. I told her when she stops at the check in desk she just needs to tell them she is here to see Okey Regal about a sleep study. Pt is grateful for the call today and the help. I will update Dr. Michaelle Copas nurse Victorino Dike. Will update our sleep study person Coralee North; though study has already been approved, see notes from Claiborne Rigg.

## 2020-12-31 NOTE — Telephone Encounter (Signed)
Patient Name: Sonya Gilbert        DOB: 04/19/1951      Height: 5\' 3"      Office Name: [facility]         Referring Provider: [facility]. [facility]  Today's Date: 12/31/20  Date:   STOP BANG RISK ASSESSMENT S (snore) Have you been told that you snore?     YES   T (tired) Are you often tired, fatigued, or sleepy during the day?   YES  O (obstruction) Do you stop breathing, choke, or gasp during sleep? YES   P (pressure) Do you have or are you being treated for high blood pressure? YES   B (BMI) Is your body index greater than 35 kg/m? YES   A (age) Are you 39 years old or older? YES   N (neck) Do you have a neck circumference greater than 16 inches?   YES   G (gender) Are you a female? NO   TOTAL STOP/BANG "YES" ANSWERS 7                                                                       For Office Use Only              Procedure Order Form    YES to 3+ Stop Bang questions OR two clinical symptoms - patient qualifies for WatchPAT (CPT 95800)             Clinical Notes: Will consult Sleep Specialist and refer for management of therapy due to patient increased risk of Sleep Apnea. Ordering a sleep study due to the following two clinical symptoms: Excessive daytime sleepiness G47.10 / Gastroesophageal reflux K21.9 / Nocturia R35.1 / Morning Headaches G44.221 / Difficulty concentrating R41.840 / Memory problems or poor judgment G31.84 / Personality changes or irritability R45.4 / Loud snoring R06.83 / Depression F32.9 / Unrefreshed by sleep G47.8 / Impotence N52.9 / History of high blood pressure R03.0 / Insomnia G47.00    I understand that I am proceeding with a home sleep apnea test as ordered by my treating physician. I understand that untreated sleep apnea is a serious cardiovascular risk factor and it is my responsibility to perform the test and seek management for sleep apnea. I will be contacted with the results and be managed for sleep apnea by a local sleep  physician. I will be receiving equipment and further instructions from [facility]. I shall promptly ship back the equipment via the included mailing label. I understand my insurance will be billed for the test and as the patient I am responsible for any insurance related out-of-pocket costs incurred. I have been provided with written instructions and can call for additional video or telephonic instruction, with 24-hour availability of qualified personnel to answer any questions: Patient Help Desk [phone].  Patient Signature ______________________________________________________   Date______________________ Patient Telemedicine Verbal Consent

## 2020-12-31 NOTE — Telephone Encounter (Signed)
Pt came in today and has been given the Itamar Sleep Study. Pt has already been approved as well. Pt has been given PIN # [id number]. Pt will do sleep study tomorrow night 01/01/21. I apologized to the pt for having to come back out to the office. Pt thanked me for the help.

## 2021-01-02 ENCOUNTER — Encounter (INDEPENDENT_AMBULATORY_CARE_PROVIDER_SITE_OTHER): Payer: BC Managed Care – PPO | Admitting: Cardiology

## 2021-01-02 DIAGNOSIS — G4733 Obstructive sleep apnea (adult) (pediatric): Secondary | ICD-10-CM

## 2021-01-03 ENCOUNTER — Ambulatory Visit: Payer: BC Managed Care – PPO

## 2021-01-03 DIAGNOSIS — R0681 Apnea, not elsewhere classified: Secondary | ICD-10-CM

## 2021-01-03 DIAGNOSIS — R0683 Snoring: Secondary | ICD-10-CM

## 2021-01-05 NOTE — Procedures (Signed)
   Sleep Study Report  Patient Information Study Date: Jan 02, 2021 Patient Name: Sonya Gilbert Patient ID: [id number] Birth Date: [birth year] Age: 70 Gender: Female BMI: 74.1 (W=297 lb, H=4' 5'') Neck Circ.: 17 '' Referring Physician: Verdis Prime, MD  TEST DESCRIPTION: Home sleep apnea testing was completed using the WatchPat, a Type 1 device, utilizing peripheral arterial tonometry (PAT), chest movement, actigraphy, pulse oximetry, pulse rate, body position and snore. AHI was calculated with apnea and hypopnea using valid sleep time as the denominator. RDI includes apneas, hypopneas, and RERAs. The data acquired and the scoring of sleep and all associated events were performed in accordance with the recommended standards and specifications as outlined in the AASM Manual for the Scoring of Sleep and Associated Events 2.2.0 (2015).  FINDINGS: 1. Moderate Obstructive Sleep Apnea with AHI 19/hr. 2. No Central Sleep Apnea with pAHIc 1.2/hr. 3. Oxygen desaturations as low as 84%. 4. Moderate snoring was present. O2 sats were < 88% for 2.3 min. 5. Total sleep time was 5 hrs and 38 min. 6. 23% of total sleep time was spent in REM sleep. 7. Normal sleep onset latency at 20 min. 8. Normal REM sleep onset latency at 88 min. 9. Total awakenings were 18.  DIAGNOSIS: Moderate Obstructive Sleep Apnea (G47.33)  RECOMMENDATIONS 1. Clinical correlation of these findings is necessary. The decision to treat obstructive sleep apnea (OSA) is usually based on the presence of apnea symptoms or the presence of associated medical conditions such as Hypertension, Congestive Heart Failure, Atrial Fibrillation or Obesity. The most common symptoms of OSA are snoring, gasping for breath while sleeping, daytime sleepiness and fatigue.  2. Initiating apnea therapy is recommended given the presence of symptoms and/or associated conditions.  Recommend proceeding with one of the following:   a. Auto-CPAP  therapy with a pressure range of 5-20cm H2O.   b. An oral appliance (OA) that can be obtained from certain dentists with expertise in sleep medicine. These are primarily of use in non-obese patients with mild and moderate disease.   c. An ENT consultation which may be useful to look for specific causes of obstruction and possible treatment options.   d. If patient is intolerant to PAP therapy, consider referral to ENT for evaluation for hypoglossal nerve stimulator.  3. Close follow-up is necessary to ensure success with CPAP or oral appliance therapy for maximum benefit .  4. A follow-up oximetry study on CPAP is recommended to assess the adequacy of therapy and determine the need for supplemental oxygen or the potential need for Bi-level therapy. An arterial blood gas to determine the adequacy of baseline ventilation and oxygenation should also be considered.  5. Healthy sleep recommendations include: adequate nightly sleep (normal 7-9 hrs/night), avoidance of caffeine afternoon and alcohol near bedtime, and maintaining a sleep environment that is cool, dark and quiet.  6. Weight loss for overweight patients is recommended. Even modest amounts of weight loss can significantly improve the severity of sleep apnea.  7. Snoring recommendations include: weight loss where appropriate, side sleeping, and avoidance of alcohol before bed.  8. Operation of motor vehicle or dangerous equipment must be avoided when feeling drowsy, excessively sleepy, or mentally fatigued.  Signature: Electronically Signed: Jan 05, 2021 Armanda Magic, MD; [facility]; Diplomat, American Board of Sleep Medicine

## 2021-01-07 ENCOUNTER — Encounter: Payer: Self-pay | Admitting: *Deleted

## 2021-01-07 NOTE — Addendum Note (Signed)
Addended by: Reesa Chew on: 01/07/2021 01:02 PM   Modules accepted: Orders

## 2021-01-07 NOTE — Telephone Encounter (Signed)
-----   Message from Quintella Reichert, MD sent at 01/05/2021 10:03 PM EDT ----- Please let patient know that they have sleep apnea and recommend treating with CPAP.  Please order an auto CPAP from 4-15cm H2O with heated humidity and mask of choice.  Order overnight pulse ox on CPAP.  Followup with me in 6 weeks.

## 2021-01-07 NOTE — Telephone Encounter (Signed)
Turner, Traci R, MD  Kari Montero G, CMA Please let patient know that they have sleep apnea and recommend treating with CPAP.    Please order an auto CPAP from 4-15cm H2O with heated humidity and mask of choice.    Order overnight pulse ox on CPAP.  Followup with me in 6 weeks.    

## 2021-01-07 NOTE — Telephone Encounter (Signed)
Order placed to [facility] via community message to order an auto CPAP from 4-15cm H2O with heated humidity and mask of choice.  Order overnight pulse ox on CPAP.

## 2021-01-07 NOTE — Telephone Encounter (Signed)
This encounter was created in error - please disregard.

## 2021-03-17 ENCOUNTER — Encounter (HOSPITAL_BASED_OUTPATIENT_CLINIC_OR_DEPARTMENT_OTHER): Payer: BC Managed Care – PPO | Admitting: Cardiology

## 2021-04-26 ENCOUNTER — Encounter: Payer: Self-pay | Admitting: Family Medicine

## 2021-04-26 ENCOUNTER — Telehealth (INDEPENDENT_AMBULATORY_CARE_PROVIDER_SITE_OTHER): Payer: BC Managed Care – PPO | Admitting: Family Medicine

## 2021-04-26 VITALS — Wt 299.0 lb

## 2021-04-26 DIAGNOSIS — J019 Acute sinusitis, unspecified: Secondary | ICD-10-CM | POA: Diagnosis not present

## 2021-04-26 MED ORDER — AZITHROMYCIN 250 MG PO TABS: ORAL_TABLET | ORAL | 0 refills | Status: DC

## 2021-04-26 NOTE — Progress Notes (Signed)
Subjective:    Patient ID: Sonya Gilbert, female    DOB: June 03, 1951, 70 y.o.   MRN: [id number]  HPI Virtual Visit via Video Note  I connected with the patient on 04/26/21 at  4:00 PM EDT by a video enabled telemedicine application and verified that I am speaking with the correct person using two identifiers.  Location patient: home Location provider:work or home office Persons participating in the virtual visit: patient, provider  I discussed the limitations of evaluation and management by telemedicine and the availability of in person appointments. The patient expressed understanding and agreed to proceed.   HPI: Here for 4 days of sinus pressure, PND, and coughing up yellow sputum. No chest pain or SOB or fever. Using Mucinex and Robitussin. She tested negative for the Covid-19 virus 2 days ago.    ROS: See pertinent positives and negatives per HPI.  Past Medical History:  Diagnosis Date   Allergy    seasonal   Anemia    past hx    Arthritis    Asthma    Chicken pox    Chronic kidney disease    Family history of colon cancer in father    age 65 and sister age 5    GERD (gastroesophageal reflux disease)    History of frequent urinary tract infections    Hypertension    Obesity    Osteopenia    Peptic ulcer    history of PUD    Past Surgical History:  Procedure Laterality Date   ABDOMINAL HYSTERECTOMY  1996   BREAST SURGERY  2014   biopsy   COLONOSCOPY  2004   [facility]- normal    TONSILLECTOMY     At 70 yrs old    Family History  Problem Relation Age of Onset   Cancer Sister        breast   Hypertension Sister    Deep vein thrombosis Sister    Breast cancer Sister    Heart disease Mother        CHF   Hypertension Mother    Deep vein thrombosis Mother    Cancer Father        Colon   Colon cancer Father 18   Cancer Sister        Colon   Colon cancer Sister 64   Hypertension Brother    Colon polyps Brother    Esophageal cancer Neg Hx     Rectal cancer Neg Hx    Stomach cancer Neg Hx      Current Outpatient Medications:    albuterol (VENTOLIN HFA) 108 (90 Base) MCG/ACT inhaler, Inhale 2 puffs into the lungs every 6 (six) hours as needed for wheezing or shortness of breath., Disp: 18 g, Rfl: 3   azithromycin (ZITHROMAX Z-PAK) 250 MG tablet, As directed, Disp: 6 each, Rfl: 0   Cholecalciferol 100 MCG (4000 UT) CAPS, Take 2,000 Units by mouth daily., Disp: , Rfl:    Fluticasone-Salmeterol (ADVAIR DISKUS) 250-50 MCG/DOSE AEPB, Inhale 1 puff into the lungs 2 (two) times daily., Disp: 1 each, Rfl: 3   loratadine (CLARITIN) 10 MG tablet, Take 10 mg by mouth daily., Disp: , Rfl:    metoprolol succinate (TOPROL-XL) 25 MG 24 hr tablet, TAKE 1/2 TABLET BY MOUTH EVERY DAY, Disp: 45 tablet, Rfl: 3   olmesartan-hydrochlorothiazide (BENICAR HCT) 20-12.5 MG tablet, TAKE 1 TABLET BY MOUTH EVERY DAY, Disp: 90 tablet, Rfl: 2  Current Facility-Administered Medications:    0.9 %  sodium  chloride infusion, 500 mL, Intravenous, Once, Pyrtle, Carie Caddy, MD  EXAM:  VITALS per patient if applicable:  GENERAL: alert, oriented, appears well and in no acute distress  HEENT: atraumatic, conjunttiva clear, no obvious abnormalities on inspection of external nose and ears  NECK: normal movements of the head and neck  LUNGS: on inspection no signs of respiratory distress, breathing rate appears normal, no obvious gross SOB, gasping or wheezing  CV: no obvious cyanosis  MS: moves all visible extremities without noticeable abnormality  PSYCH/NEURO: pleasant and cooperative, no obvious depression or anxiety, speech and thought processing grossly intact  ASSESSMENT AND PLAN: Sinusitis ,treat with a Zpack. Recheck as needed.  Gershon Crane, MD  Discussed the following assessment and plan:  No diagnosis found.     I discussed the assessment and treatment plan with the patient. The patient was provided an opportunity to ask questions and all were  answered. The patient agreed with the plan and demonstrated an understanding of the instructions.   The patient was advised to call back or seek an in-person evaluation if the symptoms worsen or if the condition fails to improve as anticipated.      Review of Systems     Objective:   Physical Exam        Assessment & Plan:

## 2021-04-27 ENCOUNTER — Telehealth: Payer: Self-pay | Admitting: Family Medicine

## 2021-04-27 NOTE — Telephone Encounter (Signed)
Pt call and stated the pharmacy stop her from picking up the antibiotic the was call in for her because she is allergic to something that is in it.pt want something else call in.

## 2021-04-27 NOTE — Telephone Encounter (Signed)
Cancel the Azithromycin. Instead please call in Augmentin 875 BID for 10 days

## 2021-04-28 ENCOUNTER — Encounter: Payer: Self-pay | Admitting: Family Medicine

## 2021-04-28 ENCOUNTER — Other Ambulatory Visit: Payer: Self-pay

## 2021-04-28 MED ORDER — AMOXICILLIN-POT CLAVULANATE 875-125 MG PO TABS: 1.0000 | ORAL_TABLET | Freq: Two times a day (BID) | ORAL | 0 refills | Status: DC

## 2021-04-28 NOTE — Telephone Encounter (Signed)
Augmentin 875mg  sent to [facility] at [address].  Patient has been in communication via my chart, reply sent there to inform patient.

## 2021-04-29 ENCOUNTER — Other Ambulatory Visit: Payer: Self-pay

## 2021-04-29 NOTE — Progress Notes (Signed)
HPI: Sonya Gilbert is a 70 y.o. female, who is here today for 6 months follow up.   She was last seen on 10/28/20.  She has a virtual visit with Dr Clent Ridges, Dx'ed with sinusitis. She is on Augmentin 875-125 mg bid. Cough causes right sided pain. Dysphonia has improved. Nasal congestion and rhinorrhea. She takes Claritin 10 mg daily.  Productive cough with thick sputum, no hemoptysis. Nose bleed a few times. In general she is feeling better.  Negative for fever and chills.  Hypertension:  Medications:Olmesartan-HCTZ 20-12.5 mg and Metoprolol succinate 25 mg daily. BP readings at home:Not checking. Side effects:None.  Negative for unusual or severe headache, visual changes, exertional chest pain,orthopnea,PND,palpitations, focal weakness, or worsening edema.  Lab Results  Component Value Date   CREATININE 0.83 10/28/2020   BUN 15 10/28/2020   NA 142 10/28/2020   K 3.8 10/28/2020   CL 104 10/28/2020   CO2 30 10/28/2020   Asthma: Last visit Advair 250-50 mcg was added,she uses it almost daily. Albuterol inh causes tachycardia and tremor. In general symptoms have improved but for the past few weeks she has had wheezing and mild SOB with exertion. Symptoms are usually worse with seasonal changes.  No hx of tobacco use.  She has tried to eat better, she has some old clothes that are now fitting better. She has not been consistent with regular physical activity.  Dx'ed OSA and waiting on CPAP. States that she is having "lung study" done because O2 sats were low during sleep study.  Unstable gait: She feels like mobility has been more difficult. She would like to try PT. She has not had any fall recently but had a few until 6 months ago. OA, mainly knees. She is following with ortho.  Review of Systems  Constitutional:  Positive for fatigue. Negative for unexpected weight change.  HENT:  Negative for mouth sores and sore throat.   Gastrointestinal:  Negative for  abdominal pain, nausea and vomiting.  Genitourinary:  Negative for decreased urine volume and hematuria.  Musculoskeletal:  Positive for arthralgias (Mainly left knee.) and gait problem.  Skin:  Negative for pallor and rash.  Allergic/Immunologic: Positive for environmental allergies.  Neurological:  Negative for syncope, facial asymmetry and weakness.  Rest of ROS, see pertinent positives sand negatives in HPI  Current Outpatient Medications on File Prior to Visit  Medication Sig Dispense Refill   albuterol (VENTOLIN HFA) 108 (90 Base) MCG/ACT inhaler Inhale 2 puffs into the lungs every 6 (six) hours as needed for wheezing or shortness of breath. 18 g 3   amoxicillin-clavulanate (AUGMENTIN) 875-125 MG tablet Take 1 tablet by mouth 2 (two) times daily. 20 tablet 0   Cholecalciferol 100 MCG (4000 UT) CAPS Take 2,000 Units by mouth daily.     Fluticasone-Salmeterol (ADVAIR DISKUS) 250-50 MCG/DOSE AEPB Inhale 1 puff into the lungs 2 (two) times daily. 1 each 3   loratadine (CLARITIN) 10 MG tablet Take 10 mg by mouth daily.     metoprolol succinate (TOPROL-XL) 25 MG 24 hr tablet TAKE 1/2 TABLET BY MOUTH EVERY DAY (Patient taking differently: 25 mg.) 45 tablet 3   olmesartan-hydrochlorothiazide (BENICAR HCT) 20-12.5 MG tablet TAKE 1 TABLET BY MOUTH EVERY DAY 90 tablet 2   Current Facility-Administered Medications on File Prior to Visit  Medication Dose Route Frequency Provider Last Rate Last Admin   0.9 %  sodium chloride infusion  500 mL Intravenous Once Pyrtle, Carie Caddy, MD  Past Medical History:  Diagnosis Date   Allergy    seasonal   Anemia    past hx    Arthritis    Asthma    Chicken pox    Chronic kidney disease    Family history of colon cancer in father    age 68 and sister age 22    GERD (gastroesophageal reflux disease)    History of frequent urinary tract infections    Hypertension    Obesity    Osteopenia    Peptic ulcer    history of PUD   Allergies  Allergen  Reactions   Codeine Palpitations   Erythromycin Hives and Nausea And Vomiting   Other     Bees, mold, grass, cats     Social History   Socioeconomic History   Marital status: Widowed    Spouse name: Not on file   Number of children: Not on file   Years of education: Not on file   Highest education level: Not on file  Occupational History   Not on file  Tobacco Use   Smoking status: Never   Smokeless tobacco: Never  Substance and Sexual Activity   Alcohol use: No    Alcohol/week: 1.0 standard drink    Types: 1 Glasses of wine per week    Comment: 1 glass wine Q 3-4 months    Drug use: No   Sexual activity: Not on file  Other Topics Concern   Not on file  Social History Narrative   Not on file   Social Determinants of Health   Financial Resource Strain: Not on file  Food Insecurity: Not on file  Transportation Needs: Not on file  Physical Activity: Not on file  Stress: Not on file  Social Connections: Not on file   Vitals:   04/30/21 0906  BP: 134/72  Pulse: (!) 102  Resp: 16  Temp: 98.3 F (36.8 C)  SpO2: 96%   Wt Readings from Last 3 Encounters:  04/30/21 299 lb (135.6 kg)  04/26/21 299 lb (135.6 kg)  10/28/20 299 lb (135.6 kg)   Body mass index is 52.97 kg/m.  Physical Exam Vitals and nursing note reviewed.  Constitutional:      General: She is not in acute distress.    Appearance: She is well-developed.  HENT:     Head: Normocephalic and atraumatic.     Right Ear: Tympanic membrane, ear canal and external ear normal.     Left Ear: Tympanic membrane, ear canal and external ear normal.     Nose:     Right Turbinates: Enlarged.     Left Turbinates: Not enlarged.     Right Sinus: No maxillary sinus tenderness.     Left Sinus: No maxillary sinus tenderness.     Mouth/Throat:     Mouth: Mucous membranes are moist.     Pharynx: Oropharynx is clear.  Eyes:     Conjunctiva/sclera: Conjunctivae normal.  Cardiovascular:     Rate and Rhythm:  Tachycardia present. Rhythm irregular.     Pulses:          Dorsalis pedis pulses are 2+ on the right side and 2+ on the left side.     Heart sounds: No murmur heard. Pulmonary:     Effort: Pulmonary effort is normal. No respiratory distress.     Breath sounds: Normal breath sounds.  Abdominal:     Palpations: Abdomen is soft. There is no hepatomegaly or mass.     Tenderness:  There is no abdominal tenderness.  Musculoskeletal:     Right lower leg: 1+ Pitting Edema present.     Left lower leg: 1+ Pitting Edema present.  Lymphadenopathy:     Cervical: No cervical adenopathy.  Skin:    General: Skin is warm.     Findings: No erythema or rash.  Neurological:     General: No focal deficit present.     Mental Status: She is alert and oriented to person, place, and time.     Cranial Nerves: No cranial nerve deficit.     Gait: Gait normal.     Comments: Antalgic gait, not assisted.  Psychiatric:     Comments: Well groomed, good eye contact.   ASSESSMENT AND PLAN:  Sonya Gilbert was seen today for 6 months follow-up.  Orders Placed This Encounter  Procedures   Basic metabolic panel   Ambulatory referral to Physical Therapy   Lab Results  Component Value Date   CREATININE 0.81 04/30/2021   BUN 13 04/30/2021   NA 137 04/30/2021   K 3.9 04/30/2021   CL 102 04/30/2021   CO2 26 04/30/2021   Risk for falls Some of her medical problems can increase her risk for falls. Fall precautions discussed. PT will be arranged.  Hypertension, essential, benign BP adequately controlled. Continue current management: Olmesartan-HCTZ and Metoprolol succinate same dose. DASH/low salt diet recommended. Monitor BP at home.  Asthma in adult, mild intermittent, uncomplicated Better controlled. Symptoms exacerbated by recent weather changes and URI. Recommend using Advair daily, bid. Albuterol inh 1 puff qid prn. Wt loss definitely will help.  Morbid obesity (HCC) We discussed  benefits of wt loss as well as adverse effects of obesity. Consistency with healthy diet and physical activity recommended. Walking for 10 min daily and increase as tolerated, walking in place while watching TV is a good option.  Return in about 4 months (around 08/31/2021).  Shasta Chinn G. [place], MD  [facility]. [place] office.  A few things to remember from today's visit:  Asthma in adult, mild intermittent, uncomplicated  Hypertension, essential, benign - Plan: Basic metabolic panel  Risk for falls - Plan: Ambulatory referral to Physical Therapy  If you need refills please call your pharmacy. Do not use My Chart to request refills or for acute issues that need immediate attention.   Count calories, 1700 kcal/day. 10 min of daily walking as tolerated. Increase vegetables intake, caution with fruit.  Use Advair daily, rinse mouth. Albuterol inh 1 puff instead 2.  Nasal saline irrigations. Complete antibiotic treatment.  Pending CPAP.  Please be sure medication list is accurate. If a new problem present, please set up appointment sooner than planned today.

## 2021-04-30 ENCOUNTER — Ambulatory Visit (INDEPENDENT_AMBULATORY_CARE_PROVIDER_SITE_OTHER): Payer: BC Managed Care – PPO | Admitting: Family Medicine

## 2021-04-30 ENCOUNTER — Encounter: Payer: Self-pay | Admitting: Family Medicine

## 2021-04-30 VITALS — BP 134/72 | HR 102 | Temp 98.3°F | Resp 16 | Ht 63.0 in | Wt 299.0 lb

## 2021-04-30 DIAGNOSIS — J4520 Mild intermittent asthma, uncomplicated: Secondary | ICD-10-CM

## 2021-04-30 DIAGNOSIS — Z9181 History of falling: Secondary | ICD-10-CM | POA: Diagnosis not present

## 2021-04-30 DIAGNOSIS — I10 Essential (primary) hypertension: Secondary | ICD-10-CM

## 2021-04-30 LAB — BASIC METABOLIC PANEL
BUN: 13 mg/dL (ref 6–23)
CO2: 26 mEq/L (ref 19–32)
Calcium: 9.1 mg/dL (ref 8.4–10.5)
Chloride: 102 mEq/L (ref 96–112)
Creatinine, Ser: 0.81 mg/dL (ref 0.40–1.20)
GFR: 74.75 mL/min (ref >60.00)
Glucose, Bld: 101 mg/dL — ABNORMAL HIGH (ref 70–99)
Potassium: 3.9 mEq/L (ref 3.5–5.1)
Sodium: 137 mEq/L (ref 135–145)

## 2021-04-30 NOTE — Patient Instructions (Addendum)
A few things to remember from today's visit:  Asthma in adult, mild intermittent, uncomplicated  Hypertension, essential, benign - Plan: Basic metabolic panel  Risk for falls - Plan: Ambulatory referral to Physical Therapy  If you need refills please call your pharmacy. Do not use My Chart to request refills or for acute issues that need immediate attention.   Count calories, 1700 kcal/day. 10 min of daily walking as tolerated. Increase vegetables intake, caution with fruit.  Use Advair daily, rinse mouth. Albuterol inh 1 puff instead 2.  Nasal saline irrigations. Complete antibiotic treatment.  Pending CPAP.  Please be sure medication list is accurate. If a new problem present, please set up appointment sooner than planned today.

## 2021-04-30 NOTE — Assessment & Plan Note (Addendum)
We discussed benefits of wt loss as well as adverse effects of obesity. Consistency with healthy diet and physical activity recommended. Walking for 10 min daily and increase as tolerated, walking in place while watching TV is a good option.

## 2021-04-30 NOTE — Assessment & Plan Note (Signed)
Better controlled. Symptoms exacerbated by recent weather changes and URI. Recommend using Advair daily, bid. Albuterol inh 1 puff qid prn. Wt loss definitely will help.

## 2021-04-30 NOTE — Assessment & Plan Note (Addendum)
BP adequately controlled. Continue current management: Olmesartan-HCTZ and Metoprolol succinate same dose. DASH/low salt diet recommended. Monitor BP at home.

## 2021-05-12 ENCOUNTER — Other Ambulatory Visit: Payer: Self-pay | Admitting: Family Medicine

## 2021-05-12 DIAGNOSIS — I10 Essential (primary) hypertension: Secondary | ICD-10-CM

## 2021-05-12 DIAGNOSIS — J4520 Mild intermittent asthma, uncomplicated: Secondary | ICD-10-CM

## 2021-05-14 ENCOUNTER — Other Ambulatory Visit: Payer: Self-pay | Admitting: Family Medicine

## 2021-05-14 DIAGNOSIS — I10 Essential (primary) hypertension: Secondary | ICD-10-CM

## 2021-05-14 DIAGNOSIS — J4520 Mild intermittent asthma, uncomplicated: Secondary | ICD-10-CM

## 2021-07-08 ENCOUNTER — Other Ambulatory Visit: Payer: Self-pay

## 2021-07-08 ENCOUNTER — Ambulatory Visit: Payer: BC Managed Care – PPO | Attending: Family Medicine

## 2021-07-08 DIAGNOSIS — R262 Difficulty in walking, not elsewhere classified: Secondary | ICD-10-CM | POA: Insufficient documentation

## 2021-07-08 DIAGNOSIS — M6281 Muscle weakness (generalized): Secondary | ICD-10-CM | POA: Insufficient documentation

## 2021-07-08 DIAGNOSIS — R296 Repeated falls: Secondary | ICD-10-CM | POA: Insufficient documentation

## 2021-07-08 DIAGNOSIS — Z9181 History of falling: Secondary | ICD-10-CM | POA: Diagnosis not present

## 2021-07-08 NOTE — Patient Instructions (Signed)
Access Code: [email or web address] URL: [email or web address] Date: 07/08/2021 Prepared by: Mikey Kirschner  Exercises Sit to Stand - 1 x daily - 7 x weekly - 1 sets - 5 reps Sit to Stand Without Arm Support - 1 x daily - 7 x weekly - 1 sets - 5 reps Seated Toe Raise - 1 x daily - 7 x weekly - 1 sets - 20 reps Seated Long Arc Quad - 1 x daily - 7 x weekly - 1 sets - 20 reps Seated March - 1 x daily - 7 x weekly - 1 sets - 20 reps

## 2021-07-08 NOTE — Therapy (Signed)
[facility] @ [facility] [address] [place], [place], [address] Phone: [phone]   Fax:  [phone]  Physical Therapy Evaluation  Patient Details  Name: Sonya Gilbert MRN: [id number] Date of Birth: 12-15-1950 Referring Provider (PT): [place], Betty G, MD   Encounter Date: 07/08/2021   PT End of Session - 07/08/21 1334     Visit Number 1    Number of Visits 30    Date for PT Re-Evaluation 09/02/21    Authorization Type BCBS    Authorization Time Period 07/18/20 through 07/17/21    Authorization - Visit Number 1    PT Start Time 1145    PT Stop Time 1235    PT Time Calculation (min) 50 min    Activity Tolerance Patient tolerated treatment well    Behavior During Therapy [facility] for tasks assessed/performed             Past Medical History:  Diagnosis Date   Allergy    seasonal   Anemia    past hx    Arthritis    Asthma    Chicken pox    Chronic kidney disease    Family history of colon cancer in father    age 36 and sister age 67    GERD (gastroesophageal reflux disease)    History of frequent urinary tract infections    Hypertension    Obesity    Osteopenia    Peptic ulcer    history of PUD    Past Surgical History:  Procedure Laterality Date   ABDOMINAL HYSTERECTOMY  1996   BREAST SURGERY  2014   biopsy   COLONOSCOPY  2004   [facility]- normal    TONSILLECTOMY     At 70 yrs old    There were no vitals filed for this visit.    Subjective Assessment - 07/08/21 1200     Subjective Patient arrives without assistive device.  She reports she has had several falls in the past 6 months.  She describes 3 where she went all the way down and 2 where she caught herself and didn't go all the way down.  She explains that her primary issue is going out of the home into the garage.  She also has a sloped driveway and has to take her garbage cans out and get her mail but she has a neighbor that is helping with the  garbage cans and she is getting her mail when she leaves the house from her car.  She lives alone and doesnt want to lose her independence.  She also has been hesitant to go out with friends to eat because she has such a hard time getting up from a chair.  She has bilateral knee OA and admits she probably needs surgery but is concerned about post surgery and how she will manage.  Her goal for PT is to reduce fall risk, to stop falling and to gain strength in her legs.    Pertinent History Obesity, bilateral knee OA    Limitations Standing;Walking    How long can you sit comfortably? unlimited    How long can you stand comfortably? 15 min    How long can you walk comfortably? 15 min    Patient Stated Goals Her goal for PT is to reduce fall risk, to stop falling and to gain strength in her legs.    Currently in Pain? No/denies  [facility] PT Assessment - 07/08/21 0001       Assessment   Medical Diagnosis Risk for falls    Referring Provider (PT) [place], Betty G, MD    Onset Date/Surgical Date 07/18/18    Hand Dominance Right    Next MD Visit not until April    Prior Therapy for her knees      Precautions   Precautions Fall      Restrictions   Weight Bearing Restrictions No      Balance Screen   Has the patient fallen in the past 6 months Yes    How many times? 5    Has the patient had a decrease in activity level because of a fear of falling?  Yes    Is the patient reluctant to leave their home because of a fear of falling?  Yes      Home Environment   Living Environment Private residence    Living Arrangements Alone    Available Help at Discharge Family;Friend(s)    Type of Home House    Home Access Stairs to enter    Entrance Stairs-Number of Steps 3    Entrance Stairs-Rails Can reach both    Home Layout Two level;Bed/bath upstairs    Alternate Level Stairs-Number of Steps 12    Alternate Level Stairs-Rails Can reach both    [employer] - single  point;Walker - 2 wheels      Prior Function   Level of Independence Independent with basic ADLs    Vocation Retired    Leisure Going out with friends, shopping      Cognition   Overall Cognitive Status Within Functional Limits for tasks assessed      Observation/Other Assessments   Observations Pleasant 70 y.o. female alert and oriented x 3      Functional Tests   Functional tests Sit to Stand      Sit to Stand   Comments needs use of hands and takes several attempts      ROM / Strength   AROM / PROM / Strength Strength      Strength   Overall Strength Deficits    Overall Strength Comments Bilateral LE's generally 4- to 4/5      Transfers   Five time sit to stand comments  25.97      Standardized Balance Assessment   Standardized Balance Assessment Timed Up and Go Test      Timed Up and Go Test   TUG Normal TUG    Normal TUG (seconds) 12.91                        Objective measurements completed on examination: See above findings.                PT Education - 07/08/21 1332     Education Details Access Code: [email or web address]  Educated on appropriate DME at home for improving home safety.    Person(s) Educated Patient    Methods Explanation;Demonstration;Verbal cues;Handout    Comprehension Verbalized understanding;Returned demonstration              PT Short Term Goals - 07/08/21 1852       PT SHORT TERM GOAL #1   Title Independence with initial HEP    Time 4    Period Weeks    Status New    Target Date 08/05/21      PT SHORT TERM GOAL #2   Title  No falls during first 4 weeks of PT episode    Time 4    Period Weeks    Status New    Target Date 08/05/21               PT Long Term Goals - 07/08/21 1854       PT LONG TERM GOAL #1   Title Independence with advanced HEP    Time 8    Period Weeks    Status New    Target Date 09/02/21      PT LONG TERM GOAL #2   Title No falls during PT episode of 8 weeks    Time 8     Period Weeks    Status New      PT LONG TERM GOAL #3   Title Bilateral LE strength to 4+/5    Time 8    Period Weeks    Status New    Target Date 09/02/21      PT LONG TERM GOAL #4   Title TUG score to be 10 sec or less    Time 8    Period Weeks    Status New    Target Date 09/02/21                    Plan - 07/08/21 1336     Clinical Impression Statement Patient is a 70 y.o. female who has experienced multiple falls in the past 6 months.  She lives alone and is concerned about losing her independence.  She has bilateral knee OA and has difficulty with sit to stand.  She has functional ROM and generalized weakness bilateral LE's.  She is a high fall risk based on her functional TUG and sit to stand tests.  She would benefit from LE strengthening and balance training as well as fall prevention.  Her goal is to stop falling and get stronger.    Personal Factors and Comorbidities Comorbidity 1;Comorbidity 2    Comorbidities Obesity and bilateral knee OA    Examination-Activity Limitations Bathing;Transfers;Bend;Lift;Squat;Toileting;Stand    Examination-Participation Restrictions Laundry;Community Activity;Cleaning;Shop    Stability/Clinical Decision Making Evolving/Moderate complexity    Clinical Decision Making Moderate    Rehab Potential Good    PT Frequency 2x / week    PT Duration 8 weeks    PT Treatment/Interventions ADLs/Self Care Home Management;Aquatic Therapy;Moist Heat;Iontophoresis 4mg /ml Dexamethasone;Electrical Stimulation;Cryotherapy;Ultrasound;Gait training;DME Instruction;Therapeutic exercise;Therapeutic activities;Functional mobility training;Stair training;Balance training;Neuromuscular re-education;Patient/family education;Manual techniques;Passive range of motion;Vestibular;Taping;Energy conservation;Dry needling    PT Next Visit Plan Begin LE strengthening, Nu STep, balance training.    PT Home Exercise Plan Access Code:  URL:  [email or web address]  Date: 07/08/2021  Prepared by: 07/10/2021    Exercises  Sit to Stand - 1 x daily - 7 x weekly - 1 sets - 5 reps  Sit to Stand Without Arm Support - 1 x daily - 7 x weekly - 1 sets - 5 reps  Seated Toe Raise - 1 x daily - 7 x weekly - 1 sets - 20 reps  Seated Long Arc Quad - 1 x daily - 7 x weekly - 1 sets - 20 reps  Seated March - 1 x daily - 7 x weekly - 1 sets - 20 reps    Consulted and Agree with Plan of Care Patient             Patient will benefit from skilled therapeutic intervention in order to improve the following deficits and impairments:  Abnormal gait, Decreased balance, Decreased endurance, Decreased mobility, Difficulty walking, Obesity, Decreased activity tolerance, Decreased strength, Pain  Visit Diagnosis: Repeated falls - Plan: PT plan of care cert/re-cert  Muscle weakness (generalized) - Plan: PT plan of care cert/re-cert  Difficulty in walking, not elsewhere classified - Plan: PT plan of care cert/re-cert     Problem List Patient Active Problem List   Diagnosis Date Noted   Asthma in adult, mild intermittent, uncomplicated 10/28/2020   Osteoarthritis of left knee 04/10/2017   Hyperlipidemia 12/30/2016   Hypertension, essential, benign 12/30/2016   Vitamin D deficiency, unspecified 12/30/2016   Morbid obesity (HCC) 12/30/2016   Varicose veins of both legs with edema 04/30/2013   Edema 01/15/2013   Peripheral vascular disease, unspecified (HCC) 01/01/2013   Discoloration of skin of lower leg-Left Lateral  01/01/2013   Pain in limb-Right medial leg 01/01/2013    Hooria Gasparini B. [place], PT 12/22/227:07 PM   [facility] @ [facility] [address] [place], [place], [address] Phone: [phone]   Fax:  [phone]  Name: Sonya Gilbert MRN: [id number] Date of Birth: 1950-10-03

## 2021-07-13 ENCOUNTER — Other Ambulatory Visit: Payer: Self-pay

## 2021-07-13 ENCOUNTER — Ambulatory Visit: Payer: BC Managed Care – PPO | Admitting: Rehabilitative and Restorative Service Providers"

## 2021-07-13 ENCOUNTER — Encounter: Payer: Self-pay | Admitting: Rehabilitative and Restorative Service Providers"

## 2021-07-13 DIAGNOSIS — R296 Repeated falls: Secondary | ICD-10-CM | POA: Diagnosis not present

## 2021-07-13 DIAGNOSIS — R262 Difficulty in walking, not elsewhere classified: Secondary | ICD-10-CM

## 2021-07-13 DIAGNOSIS — M6281 Muscle weakness (generalized): Secondary | ICD-10-CM

## 2021-07-13 NOTE — Therapy (Signed)
[facility] @ [facility] [address] [place], [place], [address] Phone: [phone]   Fax:  [phone]  Physical Therapy Treatment  Patient Details  Name: Sonya Gilbert MRN: [id number] Date of Birth: 01/10/1951 Referring Provider (PT): Gilbert, Sonya G, MD   Encounter Date: 07/13/2021   PT End of Session - 07/13/21 0736     Visit Number 2    Number of Visits 30    Date for PT Re-Evaluation 09/02/21    Authorization Type BCBS    Authorization Time Period 07/18/20 through 07/17/21    Authorization - Visit Number 2    PT Start Time 0730    PT Stop Time 0810    PT Time Calculation (min) 40 min    Activity Tolerance Patient tolerated treatment well    Behavior During Therapy [facility] for tasks assessed/performed             Past Medical History:  Diagnosis Date   Allergy    seasonal   Anemia    past hx    Arthritis    Asthma    Chicken pox    Chronic kidney disease    Family history of colon cancer in father    age 29 and sister age 92    GERD (gastroesophageal reflux disease)    History of frequent urinary tract infections    Hypertension    Obesity    Osteopenia    Peptic ulcer    history of PUD    Past Surgical History:  Procedure Laterality Date   ABDOMINAL HYSTERECTOMY  1996   BREAST SURGERY  2014   biopsy   COLONOSCOPY  2004   [facility]- normal    TONSILLECTOMY     At 70 yrs old    There were no vitals filed for this visit.   Subjective Assessment - 07/13/21 0735     Subjective Pt reports no new complaints, but some mild knee pain.  Denies any new falls.    Pertinent History Obesity, bilateral knee OA    Patient Stated Goals Her goal for PT is to reduce fall risk, to stop falling and to gain strength in her legs.    Currently in Pain? Yes    Pain Score 3     Pain Location Knee    Pain Orientation Left    Pain Descriptors / Indicators Dull;Aching    Pain Type Chronic pain    Pain Onset More  than a month ago    Pain Frequency Intermittent                               [facility] Adult PT Treatment/Exercise - 07/13/21 0001       Ambulation/Gait   Ambulation/Gait Yes    Ambulation/Gait Assistance 7: Independent    Gait Comments Ambulation around both sides of PT gym.  Pt with minor dyspnea noted following ambulation and repoted that she felt some L knee weakness.      Exercises   Exercises Knee/Hip      Knee/Hip Exercises: Aerobic   Nustep L 3 x6 min, PT present to discuss progress.      Knee/Hip Exercises: Standing   Heel Raises Both;2 sets;10 reps    Knee Flexion Strengthening;Both;2 sets;10 reps    Knee Flexion Limitations marching    Hip Flexion Stengthening;Both;1 set;10 reps;Knee straight    Hip Abduction Stengthening;Both;1 set;10 reps  Hip Extension Stengthening;Both;1 set;10 reps    Other Standing Knee Exercises 10# backwards walking x10 reps    Other Standing Knee Exercises Alt LE toe tap to 6" step x10 reps B      Knee/Hip Exercises: Seated   Long Arc Quad Strengthening;Both;2 sets;10 reps    Long Arc Quad Weight 2 lbs.    Clamshell with TheraBand Red   2x10   Marching Strengthening;Both;2 sets;10 reps    Marching Weights 2 lbs.    Hamstring Curl Strengthening;Both;2 sets;10 reps    Hamstring Limitations red tband    Sit to Sand 2 sets;5 reps;with UE support                       PT Short Term Goals - 07/13/21 0830       PT SHORT TERM GOAL #1   Title Independence with initial HEP    Status On-going      PT SHORT TERM GOAL #2   Title No falls during first 4 weeks of PT episode    Status On-going               PT Long Term Goals - 07/08/21 1854       PT LONG TERM GOAL #1   Title Independence with advanced HEP    Time 8    Period Weeks    Status New    Target Date 09/02/21      PT LONG TERM GOAL #2   Title No falls during PT episode of 8 weeks    Time 8    Period Weeks    Status New      PT  LONG TERM GOAL #3   Title Bilateral LE strength to 4+/5    Time 8    Period Weeks    Status New    Target Date 09/02/21      PT LONG TERM GOAL #4   Title TUG score to be 10 sec or less    Time 8    Period Weeks    Status New    Target Date 09/02/21                   Plan - 07/13/21 0827     Clinical Impression Statement Ms Sonya Gilbert tolerated session well and only had minimal dyspnea and complaints to L knee weakness. Pt requires UE support during standing ther ex for balance/steadying. Pt with most difficulty with standing marching. Pt requires min cuing during standing exercises for technique. Ms Sonya Gilbert continues to require skilled PT to progress towards her goal related activities and decreasing her risk of falling.    Personal Factors and Comorbidities Comorbidity 2    Comorbidities Obesity and bilateral knee OA    PT Treatment/Interventions ADLs/Self Care Home Management;Aquatic Therapy;Moist Heat;Iontophoresis 4mg /ml Dexamethasone;Electrical Stimulation;Cryotherapy;Ultrasound;Gait training;DME Instruction;Therapeutic exercise;Therapeutic activities;Functional mobility training;Stair training;Balance training;Neuromuscular re-education;Patient/family education;Manual techniques;Passive range of motion;Vestibular;Taping;Energy conservation;Dry needling    PT Next Visit Plan Begin LE strengthening, Nu STep, balance training.    Consulted and Agree with Plan of Care Patient             Patient will benefit from skilled therapeutic intervention in order to improve the following deficits and impairments:  Abnormal gait, Decreased balance, Decreased endurance, Decreased mobility, Difficulty walking, Obesity, Decreased activity tolerance, Decreased strength, Pain  Visit Diagnosis: Repeated falls  Muscle weakness (generalized)  Difficulty in walking, not elsewhere classified     Problem List Patient Active Problem  List   Diagnosis Date Noted   Asthma in adult, mild  intermittent, uncomplicated 10/28/2020   Osteoarthritis of left knee 04/10/2017   Hyperlipidemia 12/30/2016   Hypertension, essential, benign 12/30/2016   Vitamin D deficiency, unspecified 12/30/2016   Morbid obesity (HCC) 12/30/2016   Varicose veins of both legs with edema 04/30/2013   Edema 01/15/2013   Peripheral vascular disease, unspecified (HCC) 01/01/2013   Discoloration of skin of lower leg-Left Lateral  01/01/2013   Pain in limb-Right medial leg 01/01/2013    Reather Laurence, PT, DPT 07/13/2021, 8:30 AM  [facility] @ [facility] [address] [place], [place], [address] Phone: [phone]   Fax:  [phone]  Name: Sonya Gilbert MRN: [id number] Date of Birth: 07-19-1950

## 2021-07-15 ENCOUNTER — Other Ambulatory Visit: Payer: Self-pay

## 2021-07-15 ENCOUNTER — Encounter: Payer: Self-pay | Admitting: Rehabilitative and Restorative Service Providers"

## 2021-07-15 ENCOUNTER — Ambulatory Visit: Payer: BC Managed Care – PPO | Admitting: Rehabilitative and Restorative Service Providers"

## 2021-07-15 DIAGNOSIS — R296 Repeated falls: Secondary | ICD-10-CM

## 2021-07-15 DIAGNOSIS — M6281 Muscle weakness (generalized): Secondary | ICD-10-CM

## 2021-07-15 DIAGNOSIS — R262 Difficulty in walking, not elsewhere classified: Secondary | ICD-10-CM

## 2021-07-15 NOTE — Therapy (Signed)
[facility] @ [facility], [place], [address] Phone: [phone]   Fax:  [phone]  Physical Therapy Treatment  Patient Details  Name: Sonya Gilbert MRN: [id number] Date of Birth: 07-18-1951 Referring Provider (PT): [place], Betty G, MD   Encounter Date: 07/15/2021   PT End of Session - 07/15/21 0739     Visit Number 3    Number of Visits 30    Date for PT Re-Evaluation 09/02/21    Authorization Type BCBS    Authorization Time Period 07/18/20 through 07/17/21    Authorization - Visit Number 3    PT Start Time 0734    PT Stop Time 0800    PT Time Calculation (min) 26 min    Activity Tolerance Patient tolerated treatment well    Behavior During Therapy [facility] for tasks assessed/performed             Past Medical History:  Diagnosis Date   Allergy    seasonal   Anemia    past hx    Arthritis    Asthma    Chicken pox    Chronic kidney disease    Family history of colon cancer in father    age 69 and sister age 28    GERD (gastroesophageal reflux disease)    History of frequent urinary tract infections    Hypertension    Obesity    Osteopenia    Peptic ulcer    history of PUD    Past Surgical History:  Procedure Laterality Date   [facility]   BREAST SURGERY  2014   biopsy   COLONOSCOPY  2004   [facility]- normal    TONSILLECTOMY     At 70 yrs old    There were no vitals filed for this visit.   Subjective Assessment - 07/15/21 0738     Subjective I stumbled a couple of times, but no falls.    Pertinent History Obesity, bilateral knee OA    Patient Stated Goals Her goal for PT is to reduce fall risk, to stop falling and to gain strength in her legs.    Currently in Pain? Yes    Pain Score 3     Pain Location Knee    Pain Orientation Left    Pain Descriptors / Indicators Aching;Dull    Pain Type Chronic pain                                [facility] Adult PT Treatment/Exercise - 07/15/21 0001       Knee/Hip Exercises: Aerobic   Nustep L4  x5 min, PT present to discuss progress.   460 steps     Knee/Hip Exercises: Standing   Heel Raises Both;1 set;15 reps    Heel Raises Limitations 2#    Knee Flexion Strengthening;Both;1 set;15 reps    Knee Flexion Limitations marching 2#    Hip Flexion Stengthening;Both;1 set;15 reps    Hip Flexion Limitations 2#    Hip Abduction Stengthening;Both;1 set;15 reps;Knee straight    Abduction Limitations 2#    Hip Extension Stengthening;Both;1 set;15 reps;Knee straight    Extension Limitations 2#    Other Standing Knee Exercises 10# backwards walking x10 reps    Other Standing Knee Exercises Alt LE toe tap to 6" step x10 reps B      Knee/Hip Exercises: Seated   Long Arc [employer]  Strengthening;Both;1 set;15 reps    Long Arc Quad Weight 2 lbs.    Clamshell with TheraBand Red   1x15   Marching Strengthening;Both;1 set;15 reps    Marching Weights 2 lbs.    Hamstring Curl Strengthening;Both;1 set;15 reps    Hamstring Limitations red tband    Sit to Sand 2 sets;5 reps;without UE support   from PT mat                      PT Short Term Goals - 07/15/21 0817       PT SHORT TERM GOAL #1   Title Independence with initial HEP    Status Partially Met      PT SHORT TERM GOAL #2   Title No falls during first 4 weeks of PT episode    Status On-going               PT Long Term Goals - 07/08/21 1854       PT LONG TERM GOAL #1   Title Independence with advanced HEP    Time 8    Period Weeks    Status New    Target Date 09/02/21      PT LONG TERM GOAL #2   Title No falls during PT episode of 8 weeks    Time 8    Period Weeks    Status New      PT LONG TERM GOAL #3   Title Bilateral LE strength to 4+/5    Time 8    Period Weeks    Status New    Target Date 09/02/21      PT LONG TERM GOAL #4   Title TUG score to be 10 sec or less    Time 8    Period Weeks     Status New    Target Date 09/02/21                   Plan - 07/15/21 0814     Clinical Impression Statement Ms Vore continues to tolerate session well.  Pt was able to tolerate standing ther ex with 2# weights today.  She did have some dyspnea during session today, but was able to progress with minimal recovery periods. Pt without any loss of balance during session today.  Pt able to perform sit to/from stand from PT mat without UE support.  Pt continues to require skilled PT to progress towards goal related activities.    PT Treatment/Interventions ADLs/Self Care Home Management;Aquatic Therapy;Moist Heat;Iontophoresis 17m/ml Dexamethasone;Electrical Stimulation;Cryotherapy;Ultrasound;Gait training;DME Instruction;Therapeutic exercise;Therapeutic activities;Functional mobility training;Stair training;Balance training;Neuromuscular re-education;Patient/family education;Manual techniques;Passive range of motion;Vestibular;Taping;Energy conservation;Dry needling    PT Next Visit Plan LE strengthening, Nu STep, balance training.    Consulted and Agree with Plan of Care Patient             Patient will benefit from skilled therapeutic intervention in order to improve the following deficits and impairments:  Abnormal gait, Decreased balance, Decreased endurance, Decreased mobility, Difficulty walking, Obesity, Decreased activity tolerance, Decreased strength, Pain  Visit Diagnosis: Repeated falls  Muscle weakness (generalized)  Difficulty in walking, not elsewhere classified     Problem List Patient Active Problem List   Diagnosis Date Noted   Asthma in adult, mild intermittent, uncomplicated 087/86/7672  Osteoarthritis of left knee 04/10/2017   Hyperlipidemia 12/30/2016   Hypertension, essential, benign 12/30/2016   Vitamin D deficiency, unspecified 12/30/2016   Morbid obesity ([place] 12/30/2016   Varicose veins  of both legs with edema 04/30/2013   Edema 01/15/2013    Peripheral vascular disease, unspecified ([place]) 01/01/2013   Discoloration of skin of lower leg-Left Lateral  01/01/2013   Pain in limb-Right medial leg 01/01/2013    Juel Burrow, PT, DPT 07/15/2021, 8:18 AM  [facility] @ [facility], [place], [address] Phone: [phone]   Fax:  [phone]  Name: Sonya Gilbert MRN: [id number] Date of Birth: 01-Dec-1950

## 2021-07-20 ENCOUNTER — Other Ambulatory Visit: Payer: Self-pay

## 2021-07-20 ENCOUNTER — Encounter: Payer: Self-pay | Admitting: Rehabilitative and Restorative Service Providers"

## 2021-07-20 ENCOUNTER — Ambulatory Visit: Payer: BC Managed Care – PPO | Attending: Family Medicine | Admitting: Rehabilitative and Restorative Service Providers"

## 2021-07-20 DIAGNOSIS — R262 Difficulty in walking, not elsewhere classified: Secondary | ICD-10-CM | POA: Diagnosis present

## 2021-07-20 DIAGNOSIS — M6281 Muscle weakness (generalized): Secondary | ICD-10-CM | POA: Diagnosis present

## 2021-07-20 DIAGNOSIS — R296 Repeated falls: Secondary | ICD-10-CM | POA: Diagnosis present

## 2021-07-20 NOTE — Therapy (Signed)
[facility] [facility] @ [address] [place], [place], [address] Phone: [phone]   Fax:  [phone]  Physical Therapy Treatment  Patient Details  Name: Sonya Gilbert MRN: [id number] Date of Birth: January 10, 1951 Referring Provider (PT): Martinique, Betty G, MD   Encounter Date: 07/20/2021   PT End of Session - 07/20/21 0735     Visit Number 4    Number of Visits 30    Date for PT Re-Evaluation 09/02/21    Authorization Type BCBS    Authorization - Visit Number 4    PT Start Time 0730    PT Stop Time 0800    PT Time Calculation (min) 30 min    Activity Tolerance Patient tolerated treatment well    Behavior During Therapy [facility] for tasks assessed/performed             Past Medical History:  Diagnosis Date   Allergy    seasonal   Anemia    past hx    Arthritis    Asthma    Chicken pox    Chronic kidney disease    Family history of colon cancer in father    age 31 and sister age 56    GERD (gastroesophageal reflux disease)    History of frequent urinary tract infections    Hypertension    Obesity    Osteopenia    Peptic ulcer    history of PUD    Past Surgical History:  Procedure Laterality Date   [place]   BREAST SURGERY  2014   biopsy   COLONOSCOPY  2004   [place] [facility]- normal    TONSILLECTOMY     At 71 yrs old    There were no vitals filed for this visit.   Subjective Assessment - 07/20/21 0734     Subjective Pt reports that she needs to call the [employer] about her bed, as she does not feel that it is adjusting properly and is causing increased pain.    Patient Stated Goals Her goal for PT is to reduce fall risk, to stop falling and to gain strength in her legs.    Currently in Pain? Yes    Pain Score 3     Pain Location Knee    Pain Orientation Left                               [facility] Adult PT Treatment/Exercise - 07/20/21 0001       Knee/Hip  Exercises: Aerobic   Nustep L5  x5 min, PT present to discuss progress.      Knee/Hip Exercises: Standing   Heel Raises Both;1 set;15 reps    Heel Raises Limitations 2#    Knee Flexion Strengthening;Both;1 set;15 reps    Knee Flexion Limitations marching 2#    Hip Flexion Stengthening;Both;1 set;15 reps    Hip Flexion Limitations 2#    Hip Abduction Stengthening;Both;1 set;15 reps;Knee straight    Abduction Limitations 2#    Hip Extension Stengthening;Both;1 set;15 reps;Knee straight    Extension Limitations 2#    Other Standing Knee Exercises 15# backwards walking x10 reps    Other Standing Knee Exercises Alt LE toe tap to 6" step x10 reps B      Knee/Hip Exercises: Seated   Long Arc Quad Strengthening;Both;1 set;15 reps    Long Arc Quad Weight 2 lbs.    Clamshell  with TheraBand Red   1x15   Marching Strengthening;Both;1 set;15 reps    Marching Weights 2 lbs.    Hamstring Curl Strengthening;Both;1 set;15 reps    Hamstring Limitations red tband    Abduction/Adduction  Strengthening;Both;1 set;15 reps    Abd/Adduction Limitations hip abduction scissors    Abd/Adduction Weights 2 lbs.    Sit to Sand 2 sets;5 reps;without UE support   from PT mat                      PT Short Term Goals - 07/15/21 0817       PT SHORT TERM GOAL #1   Title Independence with initial HEP    Status Partially Met      PT SHORT TERM GOAL #2   Title No falls during first 4 weeks of PT episode    Status On-going               PT Long Term Goals - 07/08/21 1854       PT LONG TERM GOAL #1   Title Independence with advanced HEP    Time 8    Period Weeks    Status New    Target Date 09/02/21      PT LONG TERM GOAL #2   Title No falls during PT episode of 8 weeks    Time 8    Period Weeks    Status New      PT LONG TERM GOAL #3   Title Bilateral LE strength to 4+/5    Time 8    Period Weeks    Status New    Target Date 09/02/21      PT LONG TERM GOAL #4   Title  TUG score to be 10 sec or less    Time 8    Period Weeks    Status New    Target Date 09/02/21                   Plan - 07/20/21 0801     Clinical Impression Statement Ms Langseth continues to progress towards goal related activities. Pt able to tolerate increased weights on resisted walking exercise.  Pt required less seated recovery period between sets of sit to/from stand. Pt did not have any loss of balance during ther ex session and has not had any falls since initial eval.  Pt continues to require skilled PT to further progress towards decreased pain and decreased risk of falling.    PT Treatment/Interventions ADLs/Self Care Home Management;Aquatic Therapy;Moist Heat;Iontophoresis 80m/ml Dexamethasone;Electrical Stimulation;Cryotherapy;Ultrasound;Gait training;DME Instruction;Therapeutic exercise;Therapeutic activities;Functional mobility training;Stair training;Balance training;Neuromuscular re-education;Patient/family education;Manual techniques;Passive range of motion;Vestibular;Taping;Energy conservation;Dry needling    PT Next Visit Plan LE strengthening, Nu STep, balance training.    PT Home Exercise Plan Access Code: [email or web address] URL: [email or web address]  Date: 07/08/2021  Prepared by: JCandyce Churn   Exercises  Sit to Stand - 1 x daily - 7 x weekly - 1 sets - 5 reps  Sit to Stand Without Arm Support - 1 x daily - 7 x weekly - 1 sets - 5 reps  Seated Toe Raise - 1 x daily - 7 x weekly - 1 sets - 20 reps  Seated Long Arc Quad - 1 x daily - 7 x weekly - 1 sets - 20 reps  Seated March - 1 x daily - 7 x weekly - 1 sets - 20 reps    Consulted and Agree with Plan of  Care Patient             Patient will benefit from skilled therapeutic intervention in order to improve the following deficits and impairments:  Abnormal gait, Decreased balance, Decreased endurance, Decreased mobility, Difficulty walking, Obesity, Decreased activity tolerance, Decreased strength,  Pain  Visit Diagnosis: Repeated falls  Muscle weakness (generalized)  Difficulty in walking, not elsewhere classified     Problem List Patient Active Problem List   Diagnosis Date Noted   Asthma in adult, mild intermittent, uncomplicated 45/99/7741   Osteoarthritis of left knee 04/10/2017   Hyperlipidemia 12/30/2016   Hypertension, essential, benign 12/30/2016   Vitamin D deficiency, unspecified 12/30/2016   Morbid obesity ([place]) 12/30/2016   Varicose veins of both legs with edema 04/30/2013   Edema 01/15/2013   Peripheral vascular disease, unspecified ([place]) 01/01/2013   Discoloration of skin of lower leg-Left Lateral  01/01/2013   Pain in limb-Right medial leg 01/01/2013    Juel Burrow, PT, DPT 07/20/2021, 8:06 AM  [place] @ [place], [place], [address] Phone: [phone]   Fax:  [phone]  Name: Sonya Gilbert MRN: [id number] Date of Birth: Feb 12, 1951

## 2021-07-22 ENCOUNTER — Ambulatory Visit: Payer: BC Managed Care – PPO | Admitting: Rehabilitative and Restorative Service Providers"

## 2021-07-22 ENCOUNTER — Other Ambulatory Visit: Payer: Self-pay

## 2021-07-22 ENCOUNTER — Encounter: Payer: Self-pay | Admitting: Rehabilitative and Restorative Service Providers"

## 2021-07-22 DIAGNOSIS — R296 Repeated falls: Secondary | ICD-10-CM | POA: Diagnosis not present

## 2021-07-22 DIAGNOSIS — M6281 Muscle weakness (generalized): Secondary | ICD-10-CM

## 2021-07-22 DIAGNOSIS — R262 Difficulty in walking, not elsewhere classified: Secondary | ICD-10-CM

## 2021-07-22 NOTE — Therapy (Signed)
[facility] @ [place], [place], [address] Phone: [phone]   Fax:  [phone]  Physical Therapy Treatment  Patient Details  Name: Sonya Gilbert MRN: [id number] Date of Birth: 02-15-1951 Referring Provider (PT): Martinique, Betty G, MD   Encounter Date: 07/22/2021   PT End of Session - 07/22/21 0754     Visit Number 5    Number of Visits 30    Date for PT Re-Evaluation 09/02/21    Authorization Type BCBS    Authorization - Visit Number 5    PT Start Time 0730    PT Stop Time 0800    PT Time Calculation (min) 30 min    Activity Tolerance Patient tolerated treatment well    Behavior During Therapy [facility] for tasks assessed/performed             Past Medical History:  Diagnosis Date   Allergy    seasonal   Anemia    past hx    Arthritis    Asthma    Chicken pox    Chronic kidney disease    Family history of colon cancer in father    age 43 and sister age 83    GERD (gastroesophageal reflux disease)    History of frequent urinary tract infections    Hypertension    Obesity    Osteopenia    Peptic ulcer    history of PUD    Past Surgical History:  Procedure Laterality Date   [place]   BREAST SURGERY  2014   biopsy   COLONOSCOPY  2004   [place] [facility]- normal    TONSILLECTOMY     At 71 yrs old    There were no vitals filed for this visit.   Subjective Assessment - 07/22/21 0754     Subjective Pt reports less pain this AM.    Patient Stated Goals Her goal for PT is to reduce fall risk, to stop falling and to gain strength in her legs.    Pain Score 1     Pain Location Knee    Pain Orientation Left                               [facility] Adult PT Treatment/Exercise - 07/22/21 0001       Transfers   Five time sit to stand comments  16.9 sec from PT mat without UE use      Knee/Hip Exercises: Aerobic   Nustep L5  x5 min, PT present to discuss  progress.      Knee/Hip Exercises: Standing   Heel Raises Both;1 set;15 reps    Heel Raises Limitations 2#    Knee Flexion Strengthening;Both;1 set;15 reps    Knee Flexion Limitations marching 2#    Hip Flexion Stengthening;Both;1 set;15 reps    Hip Flexion Limitations 2#    Hip Abduction Stengthening;Both;1 set;15 reps;Knee straight    Abduction Limitations 2#    Hip Extension Stengthening;Both;1 set;15 reps;Knee straight    Extension Limitations 2#    Forward Step Up Both;1 set;10 reps;Hand Hold: 2;Step Height: 4"    Other Standing Knee Exercises Alt LE toe tap to 6" step x10 reps B      Knee/Hip Exercises: Seated   Long Arc Quad Strengthening;Both;1 set;15 reps    Long Arc Quad Weight 2 lbs.    Marching Strengthening;Both;1 set;15 reps  Marching Weights 2 lbs.    Abduction/Adduction  Strengthening;Both;1 set;15 reps    Abd/Adduction Limitations hip abduction scissors      Manual Therapy   Manual Therapy Soft tissue mobilization    Manual therapy comments in sitting    Soft tissue mobilization STM to L quad and lateral thigh                       PT Short Term Goals - 07/22/21 0914       PT SHORT TERM GOAL #1   Title Independence with initial HEP    Status Partially Met      PT SHORT TERM GOAL #2   Title No falls during first 4 weeks of PT episode    Status On-going               PT Long Term Goals - 07/22/21 0915       PT LONG TERM GOAL #1   Title Independence with advanced HEP    Status On-going      PT LONG TERM GOAL #2   Title No falls during PT episode of 8 weeks    Status On-going      PT LONG TERM GOAL #3   Title Bilateral LE strength to 4+/5    Status On-going      PT LONG TERM GOAL #4   Title TUG score to be 10 sec or less    Status On-going                   Plan - 07/22/21 0911     Clinical Impression Statement Ms Bahner continues to progress towards goal related activities.  She has greatly decreased her  time on 5 times sit to/from stand assessment. Pt continues to progress with strengthening and requires less frequent recovery periods.  Was able to progress to step up exercise with 4 inch step today. Pt continues to require skilled PT to progress towards goal related activities and decreased pain with functional mobility.    PT Treatment/Interventions ADLs/Self Care Home Management;Aquatic Therapy;Moist Heat;Iontophoresis 36m/ml Dexamethasone;Electrical Stimulation;Cryotherapy;Ultrasound;Gait training;DME Instruction;Therapeutic exercise;Therapeutic activities;Functional mobility training;Stair training;Balance training;Neuromuscular re-education;Patient/family education;Manual techniques;Passive range of motion;Vestibular;Taping;Energy conservation;Dry needling    PT Next Visit Plan LE strengthening, Nu STep, balance training.    Consulted and Agree with Plan of Care Patient             Patient will benefit from skilled therapeutic intervention in order to improve the following deficits and impairments:  Abnormal gait, Decreased balance, Decreased endurance, Decreased mobility, Difficulty walking, Obesity, Decreased activity tolerance, Decreased strength, Pain  Visit Diagnosis: Repeated falls  Muscle weakness (generalized)  Difficulty in walking, not elsewhere classified     Problem List Patient Active Problem List   Diagnosis Date Noted   Asthma in adult, mild intermittent, uncomplicated 016/04/9603  Osteoarthritis of left knee 04/10/2017   Hyperlipidemia 12/30/2016   Hypertension, essential, benign 12/30/2016   Vitamin D deficiency, unspecified 12/30/2016   Morbid obesity ([place] 12/30/2016   Varicose veins of both legs with edema 04/30/2013   Edema 01/15/2013   Peripheral vascular disease, unspecified ([place] 01/01/2013   Discoloration of skin of lower leg-Left Lateral  01/01/2013   Pain in limb-Right medial leg 01/01/2013    SJuel Burrow PT, DPT 07/22/2021, 9:16 AM  [email or web address] [email or web address] [address]Phone: [phone]  Fax:  [phone] Name: CBoston Service  SHARLEE RUFINO MRN: [id number] Date of Birth: 27-Mar-1951

## 2021-07-27 ENCOUNTER — Encounter: Payer: Self-pay | Admitting: Rehabilitative and Restorative Service Providers"

## 2021-07-27 ENCOUNTER — Other Ambulatory Visit: Payer: Self-pay

## 2021-07-27 ENCOUNTER — Ambulatory Visit: Payer: BC Managed Care – PPO | Admitting: Rehabilitative and Restorative Service Providers"

## 2021-07-27 DIAGNOSIS — R296 Repeated falls: Secondary | ICD-10-CM

## 2021-07-27 DIAGNOSIS — M6281 Muscle weakness (generalized): Secondary | ICD-10-CM

## 2021-07-27 DIAGNOSIS — R262 Difficulty in walking, not elsewhere classified: Secondary | ICD-10-CM

## 2021-07-27 NOTE — Therapy (Signed)
[facility] @ [place], [place], [address] Phone: [phone]   Fax:  [phone]  Physical Therapy Treatment  Patient Details  Name: Sonya Gilbert MRN: [id number] Date of Birth: 1951/04/05 Referring Provider (PT): Martinique, Betty G, MD   Encounter Date: 07/27/2021   PT End of Session - 07/27/21 0738     Visit Number 6    Number of Visits 30    Date for PT Re-Evaluation 09/02/21    Authorization Type BCBS    Authorization Time Period 07/18/21 through 07/17/22    Authorization - Visit Number 6    PT Start Time 0730    PT Stop Time 0800    PT Time Calculation (min) 30 min    Activity Tolerance Patient tolerated treatment well    Behavior During Therapy [facility] for tasks assessed/performed             Past Medical History:  Diagnosis Date   Allergy    seasonal   Anemia    past hx    Arthritis    Asthma    Chicken pox    Chronic kidney disease    Family history of colon cancer in father    age 65 and sister age 46    GERD (gastroesophageal reflux disease)    History of frequent urinary tract infections    Hypertension    Obesity    Osteopenia    Peptic ulcer    history of PUD    Past Surgical History:  Procedure Laterality Date   [place]   BREAST SURGERY  2014   biopsy   COLONOSCOPY  2004   [facility]- normal    TONSILLECTOMY     At 71 yrs old    There were no vitals filed for this visit.   Subjective Assessment - 07/27/21 0738     Subjective Pt denies falls or pain this morning.    Pertinent History Obesity, bilateral knee OA    Patient Stated Goals Her goal for PT is to reduce fall risk, to stop falling and to gain strength in her legs.    Currently in Pain? No/denies                               [facility] Adult PT Treatment/Exercise - 07/27/21 0001       Standardized Balance Assessment   Standardized Balance Assessment Timed Up and Go  Test      Timed Up and Go Test   TUG Normal TUG    Normal TUG (seconds) 11.2      High Level Balance   High Level Balance Activities Side stepping;Backward walking;Tandem walking    High Level Balance Comments x2 laps each, required hand held assist with tandem ambulation.      Knee/Hip Exercises: Aerobic   Nustep L5  x5 min, PT present to discuss progress.      Knee/Hip Exercises: Seated   Long Arc Quad Strengthening;Both;1 set;15 reps    Long Arc Quad Weight 3 lbs.    Marching Strengthening;Both;1 set;15 reps    Marching Weights 2 lbs.    Abduction/Adduction  Strengthening;Both;1 set;15 reps    Abd/Adduction Limitations 3# hip abduction scissors    Sit to Sand 1 set;10 reps;with UE support  PT Short Term Goals - 07/27/21 1610       PT SHORT TERM GOAL #1   Title Independence with initial HEP    Status Achieved      PT SHORT TERM GOAL #2   Title No falls during first 4 weeks of PT episode    Status Partially Met               PT Long Term Goals - 07/22/21 0915       PT LONG TERM GOAL #1   Title Independence with advanced HEP    Status On-going      PT LONG TERM GOAL #2   Title No falls during PT episode of 8 weeks    Status On-going      PT LONG TERM GOAL #3   Title Bilateral LE strength to 4+/5    Status On-going      PT LONG TERM GOAL #4   Title TUG score to be 10 sec or less    Status On-going                   Plan - 07/27/21 0804     Clinical Impression Statement Ms Charlet continues to tolerate skilled PT sessions well.  She was able to increase on seated weights and has been compliant with HEP. Pt educated on maintaining neutral foot alignment during ambulation, and not allowing foot to externally rotate into 'toe out' position. Pt requires cuing for this correction during standing balance tasks and utilized full length gym mirror during session to facilitate pt being able to make corrections. Pt  required hand held assist during tandem gait secondary to challenge to balance.    PT Treatment/Interventions ADLs/Self Care Home Management;Aquatic Therapy;Moist Heat;Iontophoresis 75m/ml Dexamethasone;Electrical Stimulation;Cryotherapy;Ultrasound;Gait training;DME Instruction;Therapeutic exercise;Therapeutic activities;Functional mobility training;Stair training;Balance training;Neuromuscular re-education;Patient/family education;Manual techniques;Passive range of motion;Vestibular;Taping;Energy conservation;Dry needling    PT Next Visit Plan LE strengthening, Nu STep, balance training.    Consulted and Agree with Plan of Care Patient             Patient will benefit from skilled therapeutic intervention in order to improve the following deficits and impairments:  Abnormal gait, Decreased balance, Decreased endurance, Decreased mobility, Difficulty walking, Obesity, Decreased activity tolerance, Decreased strength, Pain  Visit Diagnosis: Repeated falls  Muscle weakness (generalized)  Difficulty in walking, not elsewhere classified     Problem List Patient Active Problem List   Diagnosis Date Noted   Asthma in adult, mild intermittent, uncomplicated 096/10/5407  Osteoarthritis of left knee 04/10/2017   Hyperlipidemia 12/30/2016   Hypertension, essential, benign 12/30/2016   Vitamin D deficiency, unspecified 12/30/2016   Morbid obesity ([place] 12/30/2016   Varicose veins of both legs with edema 04/30/2013   Edema 01/15/2013   Peripheral vascular disease, unspecified ([place] 01/01/2013   Discoloration of skin of lower leg-Left Lateral  01/01/2013   Pain in limb-Right medial leg 01/01/2013    SJuel Burrow PT, DPT 07/27/2021, 8:09 AM  [email or web address] [address]Phone: [phone]  Fax:  [phone] Name: Sonya SALOMEMRN: [id number] of Birth: 919-Jun-1952

## 2021-07-29 ENCOUNTER — Other Ambulatory Visit: Payer: Self-pay

## 2021-07-29 ENCOUNTER — Ambulatory Visit: Payer: BC Managed Care – PPO | Admitting: Rehabilitative and Restorative Service Providers"

## 2021-07-29 ENCOUNTER — Encounter: Payer: Self-pay | Admitting: Rehabilitative and Restorative Service Providers"

## 2021-07-29 DIAGNOSIS — R296 Repeated falls: Secondary | ICD-10-CM

## 2021-07-29 DIAGNOSIS — M6281 Muscle weakness (generalized): Secondary | ICD-10-CM

## 2021-07-29 DIAGNOSIS — R262 Difficulty in walking, not elsewhere classified: Secondary | ICD-10-CM

## 2021-07-29 NOTE — Therapy (Signed)
[facility] @ [place], [place], [address] Phone: [phone]   Fax:  [phone]  Physical Therapy Treatment  Patient Details  Name: Sonya Gilbert MRN: [id number] Date of Birth: 03/27/1951 Referring Provider (PT): Martinique, Betty G, MD   Encounter Date: 07/29/2021   PT End of Session - 07/29/21 0747     Visit Number 7    Number of Visits 30    Date for PT Re-Evaluation 09/02/21    Authorization Type BCBS    Authorization Time Period 07/18/21 through 07/17/22    PT Start Time 0730    PT Stop Time 0800    PT Time Calculation (min) 30 min    Activity Tolerance Patient tolerated treatment well    Behavior During Therapy [facility] for tasks assessed/performed             Past Medical History:  Diagnosis Date   Allergy    seasonal   Anemia    past hx    Arthritis    Asthma    Chicken pox    Chronic kidney disease    Family history of colon cancer in father    age 90 and sister age 56    GERD (gastroesophageal reflux disease)    History of frequent urinary tract infections    Hypertension    Obesity    Osteopenia    Peptic ulcer    history of PUD    Past Surgical History:  Procedure Laterality Date   [place]   BREAST SURGERY  2014   biopsy   COLONOSCOPY  2004   [facility]- normal    TONSILLECTOMY     At 71 yrs old    There were no vitals filed for this visit.   Subjective Assessment - 07/29/21 0745     Subjective Pt continues to deny falls.  Reports feeling "at least 60% better."    Pertinent History Obesity, bilateral knee OA    Patient Stated Goals Her goal for PT is to reduce fall risk, to stop falling and to gain strength in her legs.    Currently in Pain? Yes    Pain Score 2     Pain Location Knee    Pain Orientation Left                               [facility] Adult PT Treatment/Exercise - 07/29/21 0001       High Level Balance   High  Level Balance Activities Side stepping;Tandem walking      Knee/Hip Exercises: Stretches   Hip Flexor Stretch Both    Hip Flexor Stretch Limitations with one foot on 2nd step and reaching forward, x10B      Knee/Hip Exercises: Aerobic   Nustep L5  x5 min, PT present to discuss progress.   483 steps     Knee/Hip Exercises: Standing   Heel Raises Both;1 set;15 reps    Heel Raises Limitations 3    Knee Flexion Strengthening;Both;1 set;15 reps    Knee Flexion Limitations marching 2#    Hip Flexion Stengthening;Both;1 set;15 reps    Hip Flexion Limitations 3    Hip Abduction Stengthening;Both;1 set;15 reps;Knee straight    Abduction Limitations 3    Hip Extension Stengthening;Both;1 set;15 reps;Knee straight    Extension Limitations 3    Rocker Board 2 minutes      Knee/Hip Exercises:  Seated   Sit to Sand 10 reps;without UE support   standing on blue foam                      PT Short Term Goals - 07/29/21 0807       PT SHORT TERM GOAL #1   Title Independence with initial HEP    Status Achieved      PT SHORT TERM GOAL #2   Title No falls during first 4 weeks of PT episode    Status Partially Met               PT Long Term Goals - 07/22/21 0915       PT LONG TERM GOAL #1   Title Independence with advanced HEP    Status On-going      PT LONG TERM GOAL #2   Title No falls during PT episode of 8 weeks    Status On-going      PT LONG TERM GOAL #3   Title Bilateral LE strength to 4+/5    Status On-going      PT LONG TERM GOAL #4   Title TUG score to be 10 sec or less    Status On-going                   Plan - 07/29/21 0804     Clinical Impression Statement Sonya Gilbert continues to progress with goal related activities. Pt reports prior to starting PT, she was barely able to stand up unassisted from her toilet, but now, she can stand up. Pt requires less cuing today for foot alignment today. Pt tolerated new ther ex well and stated that she  enjoyed the stretch on the rocker board.    PT Treatment/Interventions ADLs/Self Care Home Management;Aquatic Therapy;Moist Heat;Iontophoresis 105m/ml Dexamethasone;Electrical Stimulation;Cryotherapy;Ultrasound;Gait training;DME Instruction;Therapeutic exercise;Therapeutic activities;Functional mobility training;Stair training;Balance training;Neuromuscular re-education;Patient/family education;Manual techniques;Passive range of motion;Vestibular;Taping;Energy conservation;Dry needling    PT Next Visit Plan LE strengthening, Nu STep, balance training.    Consulted and Agree with Plan of Care Patient             Patient will benefit from skilled therapeutic intervention in order to improve the following deficits and impairments:  Abnormal gait, Decreased balance, Decreased endurance, Decreased mobility, Difficulty walking, Obesity, Decreased activity tolerance, Decreased strength, Pain  Visit Diagnosis: Repeated falls  Muscle weakness (generalized)  Difficulty in walking, not elsewhere classified     Problem List Patient Active Problem List   Diagnosis Date Noted   Asthma in adult, mild intermittent, uncomplicated 026/37/8588  Osteoarthritis of left knee 04/10/2017   Hyperlipidemia 12/30/2016   Hypertension, essential, benign 12/30/2016   Vitamin D deficiency, unspecified 12/30/2016   Morbid obesity ([place] 12/30/2016   Varicose veins of both legs with edema 04/30/2013   Edema 01/15/2013   Peripheral vascular disease, unspecified ([place] 01/01/2013   Discoloration of skin of lower leg-Left Lateral  01/01/2013   Pain in limb-Right medial leg 01/01/2013    SJuel Burrow PT, DPT 07/29/2021, 9:11 AM  [email or web address] [place] [address]Phone: [phone]  Fax:  [phone] Name: Sonya DARRAHMRN: [id number] of Birth: 905/04/1951

## 2021-08-03 ENCOUNTER — Encounter: Payer: Self-pay | Admitting: Rehabilitative and Restorative Service Providers"

## 2021-08-03 ENCOUNTER — Other Ambulatory Visit: Payer: Self-pay

## 2021-08-03 ENCOUNTER — Ambulatory Visit: Payer: BC Managed Care – PPO | Admitting: Rehabilitative and Restorative Service Providers"

## 2021-08-03 DIAGNOSIS — R262 Difficulty in walking, not elsewhere classified: Secondary | ICD-10-CM

## 2021-08-03 DIAGNOSIS — R296 Repeated falls: Secondary | ICD-10-CM | POA: Diagnosis not present

## 2021-08-03 DIAGNOSIS — M6281 Muscle weakness (generalized): Secondary | ICD-10-CM

## 2021-08-03 NOTE — Therapy (Signed)
[facility] @ [place], [place], [address] Phone: [phone]   Fax:  [phone]  Physical Therapy Treatment  Patient Details  Name: Sonya Gilbert MRN: [id number] Date of Birth: April 08, 1951 Referring Provider (PT): Martinique, Betty G, MD   Encounter Date: 08/03/2021   PT End of Session - 08/03/21 0758     Visit Number 8    Number of Visits 30    Date for PT Re-Evaluation 09/02/21    Authorization Type BCBS    Authorization Time Period 07/18/21 through 07/17/22    PT Start Time 0730    PT Stop Time 0810    PT Time Calculation (min) 40 min    Activity Tolerance Patient tolerated treatment well    Behavior During Therapy [facility] for tasks assessed/performed             Past Medical History:  Diagnosis Date   Allergy    seasonal   Anemia    past hx    Arthritis    Asthma    Chicken pox    Chronic kidney disease    Family history of colon cancer in father    age 73 and sister age 38    GERD (gastroesophageal reflux disease)    History of frequent urinary tract infections    Hypertension    Obesity    Osteopenia    Peptic ulcer    history of PUD    Past Surgical History:  Procedure Laterality Date   [place]   BREAST SURGERY  2014   biopsy   COLONOSCOPY  2004   [facility]- normal    TONSILLECTOMY     At 71 yrs old    There were no vitals filed for this visit.   Subjective Assessment - 08/03/21 0752     Subjective I have been trying to be more active since I started coming here. I can tell that I am getting better, the inside of my knee is not as sore.    Pertinent History Obesity, bilateral knee OA    Patient Stated Goals Her goal for PT is to reduce fall risk, to stop falling and to gain strength in her legs.    Currently in Pain? Yes    Pain Score 1     Pain Location Abdomen    Pain Orientation Left    Pain Descriptors / Indicators Sore    Pain Type Chronic pain                                [facility] Adult PT Treatment/Exercise - 08/03/21 0001       Transfers   Transfers Floor to Transfer    Five time sit to stand comments  14.1 sec from PT mat without UE assist.    Floor to Transfer 4: Min assist;With upper extremity assist    Floor to Transfer Details (indicate cue type and reason) Performed standing to/from floor transfer to assist with pt being able to take a bath again and fall recovery.  Utilized PT mat to act as the side edge of bathtub.  Pt required cuing and min physical assist.      High Level Balance   High Level Balance Activities Side stepping;Tandem walking      Knee/Hip Exercises: Aerobic   Nustep L5  x6 min, PT present to discuss progress.  Knee/Hip Exercises: Standing   Heel Raises Both;1 set;15 reps    Heel Raises Limitations 3    Knee Flexion Strengthening;Both;1 set;15 reps    Knee Flexion Limitations marching 3#    Hip Flexion Stengthening;Both;1 set;15 reps    Hip Flexion Limitations 3    Hip Abduction Stengthening;Both;1 set;15 reps;Knee straight    Abduction Limitations 3    Hip Extension Stengthening;Both;1 set;15 reps;Knee straight    Extension Limitations 3      Knee/Hip Exercises: Seated   Long Arc Quad Strengthening;Both;1 set;20 reps    Long Arc Quad Weight 3 lbs.    Clamshell with [employer]   2x10   Marching Strengthening;Both;1 set;15 reps    Marching Weights 3 lbs.    Abd/Adduction Limitations 3# hip abduction scissors 2x10                       PT Short Term Goals - 08/03/21 0820       PT SHORT TERM GOAL #1   Title Independence with initial HEP    Status Achieved      PT SHORT TERM GOAL #2   Title No falls during first 4 weeks of PT episode    Status Achieved               PT Long Term Goals - 07/22/21 0915       PT LONG TERM GOAL #1   Title Independence with advanced HEP    Status On-going      PT LONG TERM GOAL #2   Title No falls  during PT episode of 8 weeks    Status On-going      PT LONG TERM GOAL #3   Title Bilateral LE strength to 4+/5    Status On-going      PT LONG TERM GOAL #4   Title TUG score to be 10 sec or less    Status On-going                   Plan - 08/03/21 0817     Clinical Impression Statement Sonya Gilbert continues to make progress towards goal related activities.  She continues to have difficulty with floor to standing transfer and required min A with use of PT mat to transfer to standing position. Pt continues to require cuing to maintain straight alighnment of her toes and avoid external rotation of foot, specifically in side stepping and tandem gait. Pt continues to require skilled PT to progress towards increased independence and decreased fall risk.    Personal Factors and Comorbidities Comorbidity 2    Comorbidities Obesity and bilateral knee OA    Examination-Activity Limitations Bathing;Transfers;Bend;Lift;Squat;Toileting;Stand    PT Treatment/Interventions ADLs/Self Care Home Management;Aquatic Therapy;Moist Heat;Iontophoresis 4mg /ml Dexamethasone;Electrical Stimulation;Cryotherapy;Ultrasound;Gait training;DME Instruction;Therapeutic exercise;Therapeutic activities;Functional mobility training;Stair training;Balance training;Neuromuscular re-education;Patient/family education;Manual techniques;Passive range of motion;Vestibular;Taping;Energy conservation;Dry needling    PT Next Visit Plan LE strengthening, Nu STep, balance training.    Consulted and Agree with Plan of Care Patient             Patient will benefit from skilled therapeutic intervention in order to improve the following deficits and impairments:  Abnormal gait, Decreased balance, Decreased endurance, Decreased mobility, Difficulty walking, Obesity, Decreased activity tolerance, Decreased strength, Pain  Visit Diagnosis: Repeated falls  Muscle weakness (generalized)  Difficulty in walking, not elsewhere  classified     Problem List Patient Active Problem List   Diagnosis Date Noted   Asthma in adult,  mild intermittent, uncomplicated 99991111   Osteoarthritis of left knee 04/10/2017   Hyperlipidemia 12/30/2016   Hypertension, essential, benign 12/30/2016   Vitamin D deficiency, unspecified 12/30/2016   Morbid obesity ([facility]) 12/30/2016   Varicose veins of both legs with edema 04/30/2013   Edema 01/15/2013   Peripheral vascular disease, unspecified ([place]) 01/01/2013   Discoloration of skin of lower leg-Left Lateral  01/01/2013   Pain in limb-Right medial leg 01/01/2013    Juel Burrow, PT, DPT 08/03/2021, 8:21 AM  [facility] @ [place], [place], [address] Phone: [phone]   Fax:  [phone]  Name: Sonya Gilbert MRN: [id number] Date of Birth: Dec 28, 1950

## 2021-08-05 ENCOUNTER — Encounter: Payer: Self-pay | Admitting: Rehabilitative and Restorative Service Providers"

## 2021-08-05 ENCOUNTER — Ambulatory Visit: Payer: BC Managed Care – PPO | Admitting: Rehabilitative and Restorative Service Providers"

## 2021-08-05 ENCOUNTER — Other Ambulatory Visit: Payer: Self-pay

## 2021-08-05 ENCOUNTER — Encounter: Payer: Self-pay | Admitting: Cardiology

## 2021-08-05 DIAGNOSIS — R262 Difficulty in walking, not elsewhere classified: Secondary | ICD-10-CM

## 2021-08-05 DIAGNOSIS — R296 Repeated falls: Secondary | ICD-10-CM

## 2021-08-05 DIAGNOSIS — M6281 Muscle weakness (generalized): Secondary | ICD-10-CM

## 2021-08-05 NOTE — Therapy (Signed)
[facility] @ [facility] [address] [place], [place], [address] Phone: [phone]   Fax:  [phone]  Physical Therapy Treatment  Patient Details  Name: Sonya Gilbert MRN: [id number] Date of Birth: September 25, 1950 Referring Provider (PT): [place], Betty G, MD   Encounter Date: 08/05/2021   PT End of Session - 08/05/21 0806     Visit Number 9    Date for PT Re-Evaluation 09/02/21    Authorization Type BCBS    PT Start Time 0730    PT Stop Time 0800    PT Time Calculation (min) 30 min    Activity Tolerance Patient tolerated treatment well    Behavior During Therapy [facility] for tasks assessed/performed             Past Medical History:  Diagnosis Date   Allergy    seasonal   Anemia    past hx    Arthritis    Asthma    Chicken pox    Chronic kidney disease    Family history of colon cancer in father    age 69 and sister age 69    GERD (gastroesophageal reflux disease)    History of frequent urinary tract infections    Hypertension    Obesity    Osteopenia    Peptic ulcer    history of PUD    Past Surgical History:  Procedure Laterality Date   ABDOMINAL HYSTERECTOMY  1996   BREAST SURGERY  2014   biopsy   COLONOSCOPY  2004   [facility]- normal    TONSILLECTOMY     At 71 yrs old    There were no vitals filed for this visit.   Subjective Assessment - 08/05/21 0734     Subjective I have not been sleeping well.    Pertinent History Obesity, bilateral knee OA    Patient Stated Goals Her goal for PT is to reduce fall risk, to stop falling and to gain strength in her legs.    Currently in Pain? Yes    Pain Score 2     Pain Location Knee    Pain Orientation Left    Pain Descriptors / Indicators Sore    Pain Type Chronic pain                               [facility] Adult PT Treatment/Exercise - 08/05/21 0001       Knee/Hip Exercises: Aerobic   Nustep L5  x6 min, PT present to discuss  progress.   592 steps     Knee/Hip Exercises: Standing   Heel Raises Both;1 set;15 reps    Heel Raises Limitations 3    Knee Flexion Strengthening;Both;1 set;15 reps    Knee Flexion Limitations marching 3#    Hip Flexion Stengthening;Both;1 set;15 reps    Hip Flexion Limitations 3    Hip Abduction Stengthening;Both;1 set;15 reps;Knee straight    Abduction Limitations 3    Hip Extension Stengthening;Both;1 set;15 reps;Knee straight    Extension Limitations 3    Lateral Step Up Both;1 set;10 reps;Hand Hold: 2;Step Height: 6"    Forward Step Up Both;1 set;10 reps;Hand Hold: 2;Step Height: 6"    Rocker Board 2 minutes    Other Standing Knee Exercises 20# backwards walking x10 reps                       PT Short  Term Goals - 08/03/21 0820       PT SHORT TERM GOAL #1   Title Independence with initial HEP    Status Achieved      PT SHORT TERM GOAL #2   Title No falls during first 4 weeks of PT episode    Status Achieved               PT Long Term Goals - 07/22/21 0915       PT LONG TERM GOAL #1   Title Independence with advanced HEP    Status On-going      PT LONG TERM GOAL #2   Title No falls during PT episode of 8 weeks    Status On-going      PT LONG TERM GOAL #3   Title Bilateral LE strength to 4+/5    Status On-going      PT LONG TERM GOAL #4   Title TUG score to be 10 sec or less    Status On-going                   Plan - 08/05/21 0807     Clinical Impression Statement Ms Brunkow continues to make progress and is able to tolerate increased activities. Pt continues to require min cuing for foot placement during ther ex to avoid foot external rotation. Pt requires min cuing to maintain core stability during backwards walking with 20#. Pt only required one brief seated recovery period during ther ex of less than a minute. Pt continues to require skilled PT to progress towards goal related activiites.    Personal Factors and Comorbidities  Comorbidity 2    Comorbidities Obesity and bilateral knee OA    PT Treatment/Interventions ADLs/Self Care Home Management;Aquatic Therapy;Moist Heat;Iontophoresis 4mg /ml Dexamethasone;Electrical Stimulation;Cryotherapy;Ultrasound;Gait training;DME Instruction;Therapeutic exercise;Therapeutic activities;Functional mobility training;Stair training;Balance training;Neuromuscular re-education;Patient/family education;Manual techniques;Passive range of motion;Vestibular;Taping;Energy conservation;Dry needling    PT Next Visit Plan LE strengthening, Nu STep, balance training.    Consulted and Agree with Plan of Care Patient             Patient will benefit from skilled therapeutic intervention in order to improve the following deficits and impairments:  Abnormal gait, Decreased balance, Decreased endurance, Decreased mobility, Difficulty walking, Obesity, Decreased activity tolerance, Decreased strength, Pain  Visit Diagnosis: Repeated falls  Muscle weakness (generalized)  Difficulty in walking, not elsewhere classified     Problem List Patient Active Problem List   Diagnosis Date Noted   Asthma in adult, mild intermittent, uncomplicated 10/28/2020   Osteoarthritis of left knee 04/10/2017   Hyperlipidemia 12/30/2016   Hypertension, essential, benign 12/30/2016   Vitamin D deficiency, unspecified 12/30/2016   Morbid obesity ([facility]) 12/30/2016   Varicose veins of both legs with edema 04/30/2013   Edema 01/15/2013   Peripheral vascular disease, unspecified ([facility]) 01/01/2013   Discoloration of skin of lower leg-Left Lateral  01/01/2013   Pain in limb-Right medial leg 01/01/2013    01/03/2013, PT, DPT 08/05/2021, 8:10 AM  [facility] @ [facility] [address] [place], [place], [place] Phone: [phone]   Fax:  [phone]  Name: TRESSIE RAGIN MRN: [facility] Date of Birth: Aug 02, 1950

## 2021-08-10 ENCOUNTER — Other Ambulatory Visit: Payer: Self-pay

## 2021-08-10 ENCOUNTER — Ambulatory Visit: Payer: BC Managed Care – PPO | Admitting: Rehabilitative and Restorative Service Providers"

## 2021-08-10 ENCOUNTER — Encounter: Payer: Self-pay | Admitting: Rehabilitative and Restorative Service Providers"

## 2021-08-10 DIAGNOSIS — R262 Difficulty in walking, not elsewhere classified: Secondary | ICD-10-CM

## 2021-08-10 DIAGNOSIS — M6281 Muscle weakness (generalized): Secondary | ICD-10-CM

## 2021-08-10 DIAGNOSIS — R296 Repeated falls: Secondary | ICD-10-CM

## 2021-08-10 NOTE — Therapy (Signed)
[facility] @ [place], [place], [address] Phone: [phone]   Fax:  [phone]  Physical Therapy Treatment  Patient Details  Name: Sonya Gilbert MRN: [id number] Date of Birth: 1950-09-24 Referring Provider (PT): [place], Betty G, MD   Encounter Date: 08/10/2021  Progress Note Reporting Period 07/08/2021 to 08/10/2021  See note below for Objective Data and Assessment of Progress/Goals.       PT End of Session - 08/10/21 0739     Visit Number 10    Date for PT Re-Evaluation 09/02/21    Authorization Type BCBS    Authorization Time Period 07/18/21 through 07/17/22    PT Start Time 0730    PT Stop Time 0800    PT Time Calculation (min) 30 min    Activity Tolerance Patient tolerated treatment well    Behavior During Therapy [facility] for tasks assessed/performed             Past Medical History:  Diagnosis Date   Allergy    seasonal   Anemia    past hx    Arthritis    Asthma    Chicken pox    Chronic kidney disease    Family history of colon cancer in father    age 64 and sister age 81    GERD (gastroesophageal reflux disease)    History of frequent urinary tract infections    Hypertension    Obesity    Osteopenia    Peptic ulcer    history of PUD    Past Surgical History:  Procedure Laterality Date   ABDOMINAL HYSTERECTOMY  1996   BREAST SURGERY  2014   biopsy   COLONOSCOPY  2004   [employer] [facility]- normal    TONSILLECTOMY     At 71 yrs old    There were no vitals filed for this visit.   Subjective Assessment - 08/10/21 0738     Subjective Pt reports that she had one almost stumble over the weekend when she was getting ready to go out the door and had some items in her hand.  Pt reports that she she did not fall and caught her balance without dropping any items.    Pertinent History Obesity, bilateral knee OA    Patient Stated Goals Her goal for PT is to reduce fall risk, to stop  falling and to gain strength in her legs.    Currently in Pain? Yes    Pain Score 2     Pain Location Knee    Pain Orientation Left    Pain Descriptors / Indicators Sore    Pain Type Chronic pain                [facility] PT Assessment - 08/10/21 0001       Assessment   Medical Diagnosis Risk for falls    Referring Provider (PT) Martinique, Betty G, MD      Strength   Overall Strength Comments B hip strength of 4/5, B knee strength of 4+/5 grossly throughout.                           [facility] Adult PT Treatment/Exercise - 08/10/21 0001       Transfers   Five time sit to stand comments  11.8 sec from PT mat without UE use      Timed Up and Go Test   TUG Normal TUG  Normal TUG (seconds) 9.96      High Level Balance   High Level Balance Activities Tandem walking      Knee/Hip Exercises: Aerobic   Nustep L5  x6 min, PT present to discuss progress.      Knee/Hip Exercises: Standing   Heel Raises Both;1 set;15 reps    Heel Raises Limitations 3#    Knee Flexion Strengthening;Both;1 set;15 reps    Knee Flexion Limitations marching 3#    Hip Flexion Stengthening;Both;1 set;15 reps    Hip Flexion Limitations 3    Hip Abduction Stengthening;Both;1 set;15 reps;Knee straight    Abduction Limitations 3    Hip Extension Stengthening;Both;1 set;15 reps;Knee straight    Extension Limitations 3      Knee/Hip Exercises: Seated   Long Arc Quad Strengthening;Both;1 set;15 reps    Long Arc Quad Weight 3 lbs.    Marching Strengthening;Both;1 set;15 reps    Marching Weights 3 lbs.    Abduction/Adduction  Strengthening;Both;1 set;15 reps    Abd/Adduction Limitations 3# hip abduction scissors                       PT Short Term Goals - 08/03/21 0820       PT SHORT TERM GOAL #1   Title Independence with initial HEP    Status Achieved      PT SHORT TERM GOAL #2   Title No falls during first 4 weeks of PT episode    Status Achieved                PT Long Term Goals - 08/10/21 0803       PT LONG TERM GOAL #1   Title Independence with advanced HEP    Status Partially Met      PT LONG TERM GOAL #2   Title No falls during PT episode of 8 weeks    Status Partially Met      PT LONG TERM GOAL #3   Title Bilateral LE strength to 4+/5    Status Partially Met      PT LONG TERM GOAL #4   Title TUG score to be 10 sec or less    Status Achieved                   Plan - 08/10/21 0801     Clinical Impression Statement Ms Sabella continues to make progress towards goal related activities.  Pt has met TUG goal, but requires encouragement to walk faster with ambulation, as she has a tendency to stay her normal walking pace during TUG assessment. Pt continues to require cuing to decrease toe out during tandem and with hip abduction. Pt continues to require skilled PT to address her functional impairments to allow her to be safer and decrease her risk of falling.    Personal Factors and Comorbidities Comorbidity 2    Comorbidities Obesity and bilateral knee OA    PT Treatment/Interventions ADLs/Self Care Home Management;Aquatic Therapy;Moist Heat;Iontophoresis 47m/ml Dexamethasone;Electrical Stimulation;Cryotherapy;Ultrasound;Gait training;DME Instruction;Therapeutic exercise;Therapeutic activities;Functional mobility training;Stair training;Balance training;Neuromuscular re-education;Patient/family education;Manual techniques;Passive range of motion;Vestibular;Taping;Energy conservation;Dry needling    PT Next Visit Plan LE strengthening, Nu STep, balance training.    Consulted and Agree with Plan of Care Patient             Patient will benefit from skilled therapeutic intervention in order to improve the following deficits and impairments:  Abnormal gait, Decreased balance, Decreased endurance, Decreased mobility, Difficulty walking, Obesity, Decreased activity  tolerance, Decreased strength, Pain  Visit  Diagnosis: Repeated falls  Muscle weakness (generalized)  Difficulty in walking, not elsewhere classified     Problem List Patient Active Problem List   Diagnosis Date Noted   Asthma in adult, mild intermittent, uncomplicated 22/56/7209   Osteoarthritis of left knee 04/10/2017   Hyperlipidemia 12/30/2016   Hypertension, essential, benign 12/30/2016   Vitamin D deficiency, unspecified 12/30/2016   Morbid obesity ([place]) 12/30/2016   Varicose veins of both legs with edema 04/30/2013   Edema 01/15/2013   Peripheral vascular disease, unspecified ([place]) 01/01/2013   Discoloration of skin of lower leg-Left Lateral  01/01/2013   Pain in limb-Right medial leg 01/01/2013    Juel Burrow, PT, DPT 08/10/2021, 8:06 AM  [facility] @ [place], [place], [address] Phone: [phone]   Fax:  [phone]  Name: BRIZA BARK MRN: [id number] Date of Birth: Nov 27, 1950

## 2021-08-12 ENCOUNTER — Encounter: Payer: Self-pay | Admitting: Rehabilitative and Restorative Service Providers"

## 2021-08-12 ENCOUNTER — Other Ambulatory Visit: Payer: Self-pay

## 2021-08-12 ENCOUNTER — Ambulatory Visit: Payer: BC Managed Care – PPO | Admitting: Rehabilitative and Restorative Service Providers"

## 2021-08-12 DIAGNOSIS — M6281 Muscle weakness (generalized): Secondary | ICD-10-CM

## 2021-08-12 DIAGNOSIS — R296 Repeated falls: Secondary | ICD-10-CM | POA: Diagnosis not present

## 2021-08-12 DIAGNOSIS — R262 Difficulty in walking, not elsewhere classified: Secondary | ICD-10-CM

## 2021-08-12 NOTE — Therapy (Signed)
[facility] @ [facility] [place], [place], [address] Phone: [phone]   Fax:  [phone]  Physical Therapy Treatment  Patient Details  Name: Sonya Gilbert MRN: [id number] Date of Birth: 11/23/1950 Referring Provider (PT): Martinique, Betty G, MD   Encounter Date: 08/12/2021   PT End of Session - 08/12/21 0739     Visit Number 11    Date for PT Re-Evaluation 09/02/21    Authorization Type BCBS    Authorization Time Period 07/18/21 through 07/17/22    PT Start Time 0730    PT Stop Time 0800    PT Time Calculation (min) 30 min    Activity Tolerance Patient tolerated treatment well    Behavior During Therapy [facility] for tasks assessed/performed             Past Medical History:  Diagnosis Date   Allergy    seasonal   Anemia    past hx    Arthritis    Asthma    Chicken pox    Chronic kidney disease    Family history of colon cancer in father    age 32 and sister age 96    GERD (gastroesophageal reflux disease)    History of frequent urinary tract infections    Hypertension    Obesity    Osteopenia    Peptic ulcer    history of PUD    Past Surgical History:  Procedure Laterality Date   [place]   BREAST SURGERY  2014   biopsy   COLONOSCOPY  2004   [facility]- normal    TONSILLECTOMY     At 71 yrs old    There were no vitals filed for this visit.   Subjective Assessment - 08/12/21 0738     Subjective Pt reports that she is feeling "a lot better, a lot more sure of myself".    Pertinent History Obesity, bilateral knee OA    Patient Stated Goals Her goal for PT is to reduce fall risk, to stop falling and to gain strength in her legs.    Currently in Pain? Yes    Pain Score 2     Pain Location Knee    Pain Orientation Left    Pain Descriptors / Indicators Sore    Pain Type Chronic pain                               [facility] Adult PT Treatment/Exercise -  08/12/21 0001       Knee/Hip Exercises: Aerobic   Nustep L5  x6 min, PT present to discuss progress.      Knee/Hip Exercises: Standing   Hip Flexion Stengthening;Both;1 set;15 reps    Hip Flexion Limitations 3    Hip Abduction Stengthening;Both;1 set;15 reps;Knee straight    Abduction Limitations 3    Hip Extension Stengthening;Both;1 set;15 reps;Knee straight    Extension Limitations 3    Forward Step Up Both;1 set;10 reps;Hand Hold: 2;Step Height: 6"    Other Standing Knee Exercises High marching with 3# 20' x4.      Knee/Hip Exercises: Seated   Long Arc Quad Strengthening;Both;1 set;15 reps    Long Arc Quad Weight 3 lbs.    Marching Strengthening;Both;1 set;15 reps    Marching Weights 3 lbs.    Abduction/Adduction  Strengthening;Both;1 set;15 reps    Abd/Adduction Limitations 3# hip abduction scissors  Sit to Sand 10 reps;without UE support   standing on blue foam                      PT Short Term Goals - 08/03/21 0820       PT SHORT TERM GOAL #1   Title Independence with initial HEP    Status Achieved      PT SHORT TERM GOAL #2   Title No falls during first 4 weeks of PT episode    Status Achieved               PT Long Term Goals - 08/10/21 0803       PT LONG TERM GOAL #1   Title Independence with advanced HEP    Status Partially Met      PT LONG TERM GOAL #2   Title No falls during PT episode of 8 weeks    Status Partially Met      PT LONG TERM GOAL #3   Title Bilateral LE strength to 4+/5    Status Partially Met      PT LONG TERM GOAL #4   Title TUG score to be 10 sec or less    Status Achieved                   Plan - 08/12/21 0800     Clinical Impression Statement Ms Goshorn continues to make great progress towards goal related activities and may be ready for discharge next week, if goals met. Pt with fatigue following high marching with 3# down PT gym. Pt continues to progress towards increased strength and improved  balance. Pt would benefit from skilled PT to address functional impairments.    Personal Factors and Comorbidities Comorbidity 2    Comorbidities Obesity and bilateral knee OA    PT Treatment/Interventions ADLs/Self Care Home Management;Aquatic Therapy;Moist Heat;Iontophoresis 22m/ml Dexamethasone;Electrical Stimulation;Cryotherapy;Ultrasound;Gait training;DME Instruction;Therapeutic exercise;Therapeutic activities;Functional mobility training;Stair training;Balance training;Neuromuscular re-education;Patient/family education;Manual techniques;Passive range of motion;Vestibular;Taping;Energy conservation;Dry needling    PT Next Visit Plan LE strengthening, Nu STep, balance training.    PT Home Exercise Plan Access Code: [email or web address]   Consulted and Agree with Plan of Care Patient             Patient will benefit from skilled therapeutic intervention in order to improve the following deficits and impairments:  Abnormal gait, Decreased balance, Decreased endurance, Decreased mobility, Difficulty walking, Obesity, Decreased activity tolerance, Decreased strength, Pain  Visit Diagnosis: Repeated falls  Muscle weakness (generalized)  Difficulty in walking, not elsewhere classified     Problem List Patient Active Problem List   Diagnosis Date Noted   Asthma in adult, mild intermittent, uncomplicated 051/08/5850  Osteoarthritis of left knee 04/10/2017   Hyperlipidemia 12/30/2016   Hypertension, essential, benign 12/30/2016   Vitamin D deficiency, unspecified 12/30/2016   Morbid obesity ([place] 12/30/2016   Varicose veins of both legs with edema 04/30/2013   Edema 01/15/2013   Peripheral vascular disease, unspecified ([place] 01/01/2013   Discoloration of skin of lower leg-Left Lateral  01/01/2013   Pain in limb-Right medial leg 01/01/2013    SJuel Burrow PT, DPT 08/12/2021, 8:06 AM  [email or web address]  [id number]: [phone]  Fax:  [phone] Name: Sonya PICKERELMRN: [id number] of Birth: [birth year]

## 2021-08-17 ENCOUNTER — Ambulatory Visit: Payer: BC Managed Care – PPO | Admitting: Rehabilitative and Restorative Service Providers"

## 2021-08-17 ENCOUNTER — Other Ambulatory Visit: Payer: Self-pay

## 2021-08-17 ENCOUNTER — Encounter: Payer: Self-pay | Admitting: Rehabilitative and Restorative Service Providers"

## 2021-08-17 DIAGNOSIS — M6281 Muscle weakness (generalized): Secondary | ICD-10-CM

## 2021-08-17 DIAGNOSIS — R296 Repeated falls: Secondary | ICD-10-CM | POA: Diagnosis not present

## 2021-08-17 DIAGNOSIS — R262 Difficulty in walking, not elsewhere classified: Secondary | ICD-10-CM

## 2021-08-17 NOTE — Therapy (Signed)
[facility] @ [facility] [place], [place], [address] Phone: [phone]   Fax:  [phone]  Physical Therapy Treatment  Patient Details  Name: Sonya Gilbert MRN: [id number] Date of Birth: 01-03-1951 Referring Provider (PT): Martinique, Betty G, MD   Encounter Date: 08/17/2021   PT End of Session - 08/17/21 0735     Visit Number 12    Number of Visits 30    Date for PT Re-Evaluation 09/02/21    Authorization Type BCBS    Authorization Time Period 07/18/21 through 07/17/22    PT Start Time 0730    PT Stop Time 0800    PT Time Calculation (min) 30 min    Activity Tolerance Patient tolerated treatment well    Behavior During Therapy [facility] for tasks assessed/performed             Past Medical History:  Diagnosis Date   Allergy    seasonal   Anemia    past hx    Arthritis    Asthma    Chicken pox    Chronic kidney disease    Family history of colon cancer in father    age 63 and sister age 100    GERD (gastroesophageal reflux disease)    History of frequent urinary tract infections    Hypertension    Obesity    Osteopenia    Peptic ulcer    history of PUD    Past Surgical History:  Procedure Laterality Date   [place]   BREAST SURGERY  2014   biopsy   COLONOSCOPY  2004   [facility]- normal    TONSILLECTOMY     At 71 yrs old    There were no vitals filed for this visit.   Subjective Assessment - 08/17/21 0736     Subjective Pt reports that she is doing better, she states that Thursday she feels she will be ready for DC.    Pertinent History Obesity, bilateral knee OA    Patient Stated Goals Her goal for PT is to reduce fall risk, to stop falling and to gain strength in her legs.    Currently in Pain? Yes    Pain Score 1     Pain Location Knee    Pain Orientation Left    Pain Descriptors / Indicators Sore                               [facility] Adult PT  Treatment/Exercise - 08/17/21 0001       Knee/Hip Exercises: Stretches   Passive Hamstring Stretch Both;2 reps;20 seconds    Passive Hamstring Stretch Limitations standing with foot on 2nd step    Gastroc Stretch Both;2 reps;20 seconds    Gastroc Stretch Limitations standing on 2 inch step      Knee/Hip Exercises: Aerobic   Nustep L6  x5 min, PT present to discuss progress.      Knee/Hip Exercises: Standing   Hip Flexion Stengthening;Both;1 set;15 reps    Hip Flexion Limitations 3    Hip Abduction Stengthening;Both;1 set;15 reps;Knee straight    Abduction Limitations 3    Hip Extension Stengthening;Both;1 set;15 reps;Knee straight    Extension Limitations 3    Lateral Step Up Both;1 set;10 reps;Hand Hold: 2;Step Height: 6"    Forward Step Up Both;1 set;10 reps;Hand Hold: 1;Step Height: 6"      Knee/Hip Exercises:  Seated   Long Arc [employer] Strengthening;Both;1 set;15 reps    Long Arc Quad Weight 3 lbs.    Marching Strengthening;Both;1 set;15 reps    Marching Weights 3 lbs.    Abduction/Adduction  Strengthening;Both;1 set;15 reps    Abd/Adduction Limitations 3# hip abduction scissors    Sit to Sand 10 reps;without UE support   standing on blue foam                      PT Short Term Goals - 08/03/21 0820       PT SHORT TERM GOAL #1   Title Independence with initial HEP    Status Achieved      PT SHORT TERM GOAL #2   Title No falls during first 4 weeks of PT episode    Status Achieved               PT Long Term Goals - 08/17/21 0803       PT LONG TERM GOAL #1   Title Independence with advanced HEP    Status Achieved                   Plan - 08/17/21 0759     Clinical Impression Statement Ms Ambrocio continues to make progress towards goals and anticipate pt will be ready for discharge next session.  Added hamstring and gastroc stretch today, pt reports feeling better following stretching. Will reassess pt next visit and discharge if all  goals met.    Personal Factors and Comorbidities Comorbidity 2    Comorbidities Obesity and bilateral knee OA    PT Treatment/Interventions ADLs/Self Care Home Management;Aquatic Therapy;Moist Heat;Iontophoresis 38m/ml Dexamethasone;Electrical Stimulation;Cryotherapy;Ultrasound;Gait training;DME Instruction;Therapeutic exercise;Therapeutic activities;Functional mobility training;Stair training;Balance training;Neuromuscular re-education;Patient/family education;Manual techniques;Passive range of motion;Vestibular;Taping;Energy conservation;Dry needling    PT Next Visit Plan reassess, discharge if goals met.    Consulted and Agree with Plan of Care Patient             Patient will benefit from skilled therapeutic intervention in order to improve the following deficits and impairments:  Abnormal gait, Decreased balance, Decreased endurance, Decreased mobility, Difficulty walking, Obesity, Decreased activity tolerance, Decreased strength, Pain  Visit Diagnosis: Repeated falls  Muscle weakness (generalized)  Difficulty in walking, not elsewhere classified     Problem List Patient Active Problem List   Diagnosis Date Noted   Asthma in adult, mild intermittent, uncomplicated 015/37/9432  Osteoarthritis of left knee 04/10/2017   Hyperlipidemia 12/30/2016   Hypertension, essential, benign 12/30/2016   Vitamin D deficiency, unspecified 12/30/2016   Morbid obesity ([place] 12/30/2016   Varicose veins of both legs with edema 04/30/2013   Edema 01/15/2013   Peripheral vascular disease, unspecified ([place] 01/01/2013   Discoloration of skin of lower leg-Left Lateral  01/01/2013   Pain in limb-Right medial leg 01/01/2013    SJuel Burrow PT, DPT 08/17/2021, 8:04 AM  [email or web address] [email or web address] [address]Phone: [phone]  Fax:  [phone] Name: Sonya MILKOVICHMRN: [id number] of Birth: 9March 04, 1952

## 2021-08-19 ENCOUNTER — Ambulatory Visit: Payer: BC Managed Care – PPO | Attending: Family Medicine | Admitting: Rehabilitative and Restorative Service Providers"

## 2021-08-19 ENCOUNTER — Encounter: Payer: Self-pay | Admitting: Rehabilitative and Restorative Service Providers"

## 2021-08-19 ENCOUNTER — Other Ambulatory Visit: Payer: Self-pay

## 2021-08-19 DIAGNOSIS — R262 Difficulty in walking, not elsewhere classified: Secondary | ICD-10-CM | POA: Diagnosis present

## 2021-08-19 DIAGNOSIS — R296 Repeated falls: Secondary | ICD-10-CM | POA: Diagnosis not present

## 2021-08-19 DIAGNOSIS — M6281 Muscle weakness (generalized): Secondary | ICD-10-CM | POA: Diagnosis present

## 2021-08-19 NOTE — Therapy (Signed)
[facility] @ [facility] [place], [place], [address] Phone: [phone]   Fax:  [phone]  Physical Therapy Treatment and Discharge Summary  Patient Details  Name: Sonya Gilbert MRN: [id number] Date of Birth: [birth year] Referring Provider (PT): Martinique, Betty G, MD   Encounter Date: 08/19/2021   PT End of Session - 08/19/21 0738     Visit Number 13    Date for PT Re-Evaluation 09/02/21    Authorization Type BCBS    Authorization Time Period 07/18/21 through 07/17/22    PT Start Time 0730    PT Stop Time 0800    PT Time Calculation (min) 30 min    Activity Tolerance Patient tolerated treatment well    Behavior During Therapy [facility] for tasks assessed/performed             Past Medical History:  Diagnosis Date   Allergy    seasonal   Anemia    past hx    Arthritis    Asthma    Chicken pox    Chronic kidney disease    Family history of colon cancer in father    age 69 and sister age 76    GERD (gastroesophageal reflux disease)    History of frequent urinary tract infections    Hypertension    Obesity    Osteopenia    Peptic ulcer    history of PUD    Past Surgical History:  Procedure Laterality Date   [place]   BREAST SURGERY  2014   biopsy   COLONOSCOPY  2004   [facility]- normal    TONSILLECTOMY     At 71 yrs old    There were no vitals filed for this visit.   Subjective Assessment - 08/19/21 0739     Subjective Pt reports that she sometimes snags her foot and stumbles, but not as often and has not had a fall.    Pertinent History Obesity, bilateral knee OA    Patient Stated Goals Her goal for PT is to reduce fall risk, to stop falling and to gain strength in her legs.    Currently in Pain? Yes    Pain Score 2     Pain Location Knee    Pain Orientation Left    Pain Descriptors / Indicators Sore    Pain Type Chronic pain                [facility] PT Assessment -  08/19/21 0001       Assessment   Medical Diagnosis Risk for falls    Referring Provider (PT) Martinique, Betty G, MD      Strength   Overall Strength Comments B hip strength of 4+/5 grossly throughout      Transfers   Five time sit to stand comments  11.5 sec from PT mat without UE use.      Timed Up and Go Test   TUG Normal TUG    Normal TUG (seconds) 7.7                           [facility] Adult PT Treatment/Exercise - 08/19/21 0001       High Level Balance   High Level Balance Activities Side stepping;Tandem walking;Marching forwards      Knee/Hip Exercises: Stretches   Passive Hamstring Stretch Both;2 reps;20 seconds    Passive Hamstring Stretch Limitations standing with foot on  2nd step    Hip Flexor Stretch Both    Hip Flexor Stretch Limitations with one foot on 2nd step and reaching forward, x10B    Gastroc Stretch Both;2 reps;20 seconds    Gastroc Stretch Limitations hanging on step      Knee/Hip Exercises: Aerobic   Nustep L6  x5 min, PT present to discuss progress.   494 steps     Knee/Hip Exercises: Seated   Sit to Sand 10 reps;without UE support   standing on blue foam                      PT Short Term Goals - 08/19/21 0740       PT SHORT TERM GOAL #1   Title Independence with initial HEP    Status Achieved      PT SHORT TERM GOAL #2   Title No falls during first 4 weeks of PT episode    Status Achieved               PT Long Term Goals - 08/19/21 0740       PT LONG TERM GOAL #1   Title Independence with advanced HEP    Status Achieved      PT LONG TERM GOAL #2   Title No falls during PT episode of 8 weeks    Status Achieved      PT LONG TERM GOAL #3   Title Bilateral LE strength to 4+/5    Status Achieved   BLE strength 4+/5     PT LONG TERM GOAL #4   Title TUG score to be 10 sec or less    Status Achieved                   Plan - 08/19/21 0820     Clinical Impression Statement Ms Schutt has  made great progress towards goal related activities and has met all goals. Pt educated on continuing HEP and exercise following discharge.  Pt reports that she has a membership to [employer] and plans to utilize it.  Additionally, pt educated on [facility] at the [facility] and provided a printout.  She states that she will work towards getting involved with one of the programs on a more regular basis to continue her gains. Pt discharged from outpatient PT at this time.    Personal Factors and Comorbidities Comorbidity 2    Comorbidities Obesity and bilateral knee OA    PT Treatment/Interventions ADLs/Self Care Home Management;Aquatic Therapy;Moist Heat;Iontophoresis 4mg /ml Dexamethasone;Electrical Stimulation;Cryotherapy;Ultrasound;Gait training;DME Instruction;Therapeutic exercise;Therapeutic activities;Functional mobility training;Stair training;Balance training;Neuromuscular re-education;Patient/family education;Manual techniques;Passive range of motion;Vestibular;Taping;Energy conservation;Dry needling    PT Next Visit Plan discharge outpatient PT today.    Consulted and Agree with Plan of Care Patient             Patient will benefit from skilled therapeutic intervention in order to improve the following deficits and impairments:  Abnormal gait, Decreased balance, Decreased endurance, Decreased mobility, Difficulty walking, Obesity, Decreased activity tolerance, Decreased strength, Pain  Visit Diagnosis: Repeated falls  Muscle weakness (generalized)  Difficulty in walking, not elsewhere classified     Problem List Patient Active Problem List   Diagnosis Date Noted   Asthma in adult, mild intermittent, uncomplicated 01/65/5374   Osteoarthritis of left knee 04/10/2017   Hyperlipidemia 12/30/2016   Hypertension, essential, benign 12/30/2016   Vitamin D deficiency, unspecified 12/30/2016   Morbid obesity ([place]) 12/30/2016   Varicose veins of both legs  with edema 04/30/2013    Edema 01/15/2013   Peripheral vascular disease, unspecified ([place]) 01/01/2013   Discoloration of skin of lower leg-Left Lateral  01/01/2013   Pain in limb-Right medial leg 01/01/2013   PHYSICAL THERAPY DISCHARGE SUMMARY   Patient agrees to discharge. Patient goals were met. Patient is being discharged due to meeting the stated rehab goals.   Juel Burrow, PT, DPT 08/19/2021, 8:23 AM  [facility] @ [facility] [place], [place], [address] Phone: [phone]   Fax:  [phone]  Name: VALDA CHRISTENSON MRN: [id number] Date of Birth: 1951-03-26

## 2021-08-24 ENCOUNTER — Encounter: Payer: BC Managed Care – PPO | Admitting: Rehabilitative and Restorative Service Providers"

## 2021-08-26 ENCOUNTER — Encounter: Payer: BC Managed Care – PPO | Admitting: Rehabilitative and Restorative Service Providers"

## 2021-08-30 NOTE — Progress Notes (Signed)
Sonya Gilbert is a 71 y.o.female, who is here today for follow up.  Last follow up visit: 04/30/21  Hypertension: Dx'ed in 2004. Medications:Metoprolol Succinate 25 mg 1/2 tablet daily, olmesartan-hctz 20-12.5 mg daily. BP readings at home:130's/80's, she thinks her cuff is smaller. Side effects:None. Negative for unusual or severe headache, visual changes, exertional chest pain, dyspnea,  focal weakness, or worsening edema.  OSA: Since her last visit she started wearing CPAP.  Lab Results  Component Value Date   CREATININE 0.81 04/30/2021   BUN 13 04/30/2021   NA 137 04/30/2021   K 3.9 04/30/2021   CL 102 04/30/2021   CO2 26 04/30/2021   Asthma: She is using Advair 250-50 mcg bid daily. She uses Albuterol inh "not very often." Negative for cough,DOE,and wheezing.  PT helped a lot, she is doing exercises at home. She feels like her gait is more stable.  She is snacking on "wrong stuff": late bake potatoes chips, some fruit. She eats one meal per day. Yesterday breakfast: Skipped. Sausages,eggs, and toast around 1 pm. Dinner: Geneticist, molecular.  HLD: She is not on pharmacologic treatment. Lab Results  Component Value Date   CHOL 185 10/28/2020   HDL 72.10 10/28/2020   LDLCALC 100 (H) 10/28/2020   TRIG 65.0 10/28/2020   CHOLHDL 3 10/28/2020   Review of Systems  Constitutional:  Negative for activity change, appetite change and fever.  HENT:  Negative for mouth sores, nosebleeds and trouble swallowing.   Gastrointestinal:  Negative for abdominal pain, nausea and vomiting.       Negative for changes in bowel habits.  Genitourinary:  Negative for decreased urine volume and hematuria.  Neurological:  Negative for syncope and facial asymmetry.  Rest see pertinent positives and negatives per HPI.  Current Outpatient Medications on File Prior to Visit  Medication Sig Dispense Refill   albuterol (VENTOLIN HFA) 108 (90 Base) MCG/ACT inhaler Inhale 2 puffs into  the lungs every 6 (six) hours as needed for wheezing or shortness of breath. 18 g 3   Cholecalciferol 100 MCG (4000 UT) CAPS Take 2,000 Units by mouth daily.     loratadine (CLARITIN) 10 MG tablet Take 10 mg by mouth daily.     metoprolol succinate (TOPROL-XL) 25 MG 24 hr tablet TAKE 1/2 TABLET BY MOUTH EVERY DAY (Patient taking differently: 25 mg.) 45 tablet 3   olmesartan-hydrochlorothiazide (BENICAR HCT) 20-12.5 MG tablet TAKE 1 TABLET BY MOUTH EVERY DAY 90 tablet 2   Current Facility-Administered Medications on File Prior to Visit  Medication Dose Route Frequency Provider Last Rate Last Admin   0.9 %  sodium chloride infusion  500 mL Intravenous Once Pyrtle, Lajuan Lines, MD       Past Medical History:  Diagnosis Date   Allergy    seasonal   Anemia    past hx    Arthritis    Asthma    Chicken pox    Chronic kidney disease    Family history of colon cancer in father    age 13 and sister age 74    GERD (gastroesophageal reflux disease)    History of frequent urinary tract infections    Hypertension    Obesity    Osteopenia    Peptic ulcer    history of PUD   Allergies  Allergen Reactions   Codeine Palpitations   Erythromycin Hives and Nausea And Vomiting   Other     Bees, mold, grass, cats    Social  History   Socioeconomic History   Marital status: Widowed    Spouse name: Not on file   Number of children: Not on file   Years of education: Not on file   Highest education level: Bachelor's degree (e.g., BA, AB, BS)  Occupational History   Not on file  Tobacco Use   Smoking status: Never   Smokeless tobacco: Never  Substance and Sexual Activity   Alcohol use: No    Alcohol/week: 1.0 standard drink    Types: 1 Glasses of wine per week    Comment: 1 glass wine Q 3-4 months    Drug use: No   Sexual activity: Not on file  Other Topics Concern   Not on file  Social History Narrative   Not on file   Social Determinants of Health   Financial Resource Strain: Low Risk     Difficulty of Paying Living Expenses: Not hard at all  Food Insecurity: No Food Insecurity   Worried About Charity fundraiser in the Last Year: Never true   Ran Out of Food in the Last Year: Never true  Transportation Needs: No Transportation Needs   Lack of Transportation (Medical): No   Lack of Transportation (Non-Medical): No  Physical Activity: Insufficiently Active   Days of Exercise per Week: 2 days   Minutes of Exercise per Session: 30 min  Stress: No Stress Concern Present   Feeling of Stress : Only a little  Social Connections: Moderately Integrated   Frequency of Communication with Friends and Family: More than three times a week   Frequency of Social Gatherings with Friends and Family: Once a week   Attends Religious Services: More than 4 times per year   Active Member of [employer] or Organizations: Yes   Attends Archivist Meetings: More than 4 times per year   Marital Status: Widowed   Vitals:   08/31/21 0710  BP: 128/80  Pulse: 98  Resp: 16  SpO2: 99%   Wt Readings from Last 3 Encounters:  08/31/21 293 lb 4 oz (133 kg)  04/30/21 299 lb (135.6 kg)  04/26/21 299 lb (135.6 kg)   Body mass index is 51.95 kg/m.  Physical Exam Vitals and nursing note reviewed.  Constitutional:      General: She is not in acute distress.    Appearance: She is well-developed.  HENT:     Head: Normocephalic and atraumatic.     Mouth/Throat:     Mouth: Mucous membranes are moist.     Pharynx: Oropharynx is clear.  Eyes:     Conjunctiva/sclera: Conjunctivae normal.  Cardiovascular:     Rate and Rhythm: Normal rate. Rhythm irregular.     Pulses:          Dorsalis pedis pulses are 2+ on the right side and 2+ on the left side.     Heart sounds: No murmur heard. Pulmonary:     Effort: Pulmonary effort is normal. No respiratory distress.     Breath sounds: Normal breath sounds.  Abdominal:     Palpations: Abdomen is soft. There is no hepatomegaly or mass.      Tenderness: There is no abdominal tenderness.  Musculoskeletal:     Right lower leg: 1+ Pitting Edema present.     Left lower leg: 1+ Pitting Edema present.  Lymphadenopathy:     Cervical: No cervical adenopathy.  Skin:    General: Skin is warm.     Findings: No erythema or rash.  Neurological:  General: No focal deficit present.     Mental Status: She is alert and oriented to person, place, and time.     Cranial Nerves: No cranial nerve deficit.     Gait: Gait normal.  Psychiatric:     Comments: Well groomed, good eye contact.   ASSESSMENT AND PLAN:   [name] was seen today for follow-up.  Diagnoses and all orders for this visit: Orders Placed This Encounter  Procedures   Comprehensive metabolic panel   Lipid panel   Lab Results  Component Value Date   CHOL 200 08/31/2021   HDL 77.80 08/31/2021   LDLCALC 109 (H) 08/31/2021   TRIG 67.0 08/31/2021   CHOLHDL 3 08/31/2021   Lab Results  Component Value Date   CREATININE 0.90 08/31/2021   BUN 17 08/31/2021   NA 139 08/31/2021   K 4.0 08/31/2021   CL 104 08/31/2021   CO2 31 08/31/2021  The 10-year ASCVD risk score (Arnett DK, et al., 2019) is: 20.1%   Values used to calculate the score:     Age: 20 years     Sex: Female     Is Non-Hispanic African American: Yes     Diabetic: No     Tobacco smoker: Yes     Systolic Blood Pressure: 0000000 mmHg     Is BP treated: Yes     HDL Cholesterol: 77.8 mg/dL     Total Cholesterol: 200 mg/dL  Hypertension, essential, benign BP is adequately controlled. Continue Metoprolol Succinate 25 mg 1/2 tablet daily, olmesartan-hctz 20-12.5 mg daily. Continue monitoring BP regularly, appropriate technique reviewed. Low salt diet.  Hyperlipidemia Non pharmacologic treatment recommended for now. Further recommendations will be given according to 10 years CVD risk score and lipid panel numbers.  Asthma in adult, mild intermittent, uncomplicated Improved. Continue Advair 250-50 mcg  bid. Wt loss will help.  OSA on CPAP She has some difficulties with CPAP sometimes but willing to continue. We discussed Dx and possible complications as well as benefits of wearing CPAP daily. Following with Dr Radford Pax.  Morbid obesity ([place]) Lost about 6 Lb since 04/2021. We discussed benefits of wt loss. Consistency with healthy diet and physical activity encouraged. Increase vegetable intake recommended.  Return in about 6 months (around 02/28/2022).  Balen Woolum G. Martinique, MD  [facility]. [place] office.

## 2021-08-31 ENCOUNTER — Ambulatory Visit (INDEPENDENT_AMBULATORY_CARE_PROVIDER_SITE_OTHER): Payer: BC Managed Care – PPO | Admitting: Family Medicine

## 2021-08-31 VITALS — BP 128/80 | HR 98 | Resp 16 | Ht 63.0 in | Wt 293.2 lb

## 2021-08-31 DIAGNOSIS — G4733 Obstructive sleep apnea (adult) (pediatric): Secondary | ICD-10-CM | POA: Insufficient documentation

## 2021-08-31 DIAGNOSIS — E782 Mixed hyperlipidemia: Secondary | ICD-10-CM | POA: Diagnosis not present

## 2021-08-31 DIAGNOSIS — J4520 Mild intermittent asthma, uncomplicated: Secondary | ICD-10-CM | POA: Diagnosis not present

## 2021-08-31 DIAGNOSIS — I10 Essential (primary) hypertension: Secondary | ICD-10-CM

## 2021-08-31 DIAGNOSIS — Z9989 Dependence on other enabling machines and devices: Secondary | ICD-10-CM

## 2021-08-31 MED ORDER — FLUTICASONE-SALMETEROL 250-50 MCG/ACT IN AEPB: 1.0000 | INHALATION_SPRAY | Freq: Two times a day (BID) | RESPIRATORY_TRACT | 6 refills | Status: DC

## 2021-08-31 NOTE — Assessment & Plan Note (Signed)
Improved. Continue Advair 250-50 mcg bid. Wt loss will help.

## 2021-08-31 NOTE — Patient Instructions (Addendum)
A few things to remember from today's visit:   Hypertension, essential, benign - Plan: Comprehensive metabolic panel  Asthma in adult, mild intermittent, uncomplicated  Mixed hyperlipidemia - Plan: Lipid panel  If you need refills please call your pharmacy. Do not use My Chart to request refills or for acute issues that need immediate attention.   Continue making small changes in your diet towards a healthier diet, avoid sugar added foods and increase green vegetables intake. Continue exercises, low impact , at home.  You lost 6 Lb since your last visit, which is GREAT!!! Continue working on wearing the CPAP daily.  No changes today, I will see you in 6 months. Please be sure medication list is accurate. If a new problem present, please set up appointment sooner than planned today.

## 2021-08-31 NOTE — Assessment & Plan Note (Signed)
Non pharmacologic treatment recommended for now. Further recommendations will be given according to 10 years CVD risk score and lipid panel numbers. 

## 2021-08-31 NOTE — Assessment & Plan Note (Signed)
BP is adequately controlled. Continue Metoprolol Succinate 25 mg 1/2 tablet daily, olmesartan-hctz 20-12.5 mg daily. Continue monitoring BP regularly, appropriate technique reviewed. Low salt diet.

## 2021-08-31 NOTE — Assessment & Plan Note (Signed)
She has some difficulties with CPAP sometimes but willing to continue. We discussed Dx and possible complications as well as benefits of wearing CPAP daily. Following with Dr Mayford Knife.

## 2021-08-31 NOTE — Assessment & Plan Note (Signed)
Lost about 6 Lb since 04/2021. We discussed benefits of wt loss. Consistency with healthy diet and physical activity encouraged. Increase vegetable intake recommended.

## 2021-09-01 LAB — COMPREHENSIVE METABOLIC PANEL
ALT: 11 U/L (ref 0–35)
AST: 17 U/L (ref 0–37)
Albumin: 3.8 g/dL (ref 3.5–5.2)
Alkaline Phosphatase: 76 U/L (ref 39–117)
BUN: 17 mg/dL (ref 6–23)
CO2: 31 mEq/L (ref 19–32)
Calcium: 9.6 mg/dL (ref 8.4–10.5)
Chloride: 104 mEq/L (ref 96–112)
Creatinine, Ser: 0.9 mg/dL (ref 0.40–1.20)
GFR: 65.72 mL/min (ref >60.00)
Glucose, Bld: 91 mg/dL (ref 70–99)
Potassium: 4 mEq/L (ref 3.5–5.1)
Sodium: 139 mEq/L (ref 135–145)
Total Bilirubin: 0.8 mg/dL (ref 0.2–1.2)
Total Protein: 7.5 g/dL (ref 6.0–8.3)

## 2021-09-01 LAB — LIPID PANEL
Cholesterol: 200 mg/dL (ref 0–200)
HDL: 77.8 mg/dL (ref >39.00)
LDL Cholesterol: 109 mg/dL — ABNORMAL HIGH (ref 0–99)
NonHDL: 122.54
Total CHOL/HDL Ratio: 3
Triglycerides: 67 mg/dL (ref 0.0–149.0)
VLDL: 13.4 mg/dL (ref 0.0–40.0)

## 2021-09-24 ENCOUNTER — Ambulatory Visit: Payer: BC Managed Care – PPO | Admitting: Cardiology

## 2022-01-20 ENCOUNTER — Ambulatory Visit: Payer: BC Managed Care – PPO | Admitting: Cardiology

## 2022-02-04 ENCOUNTER — Encounter: Payer: Self-pay | Admitting: Cardiology

## 2022-02-04 ENCOUNTER — Ambulatory Visit (INDEPENDENT_AMBULATORY_CARE_PROVIDER_SITE_OTHER): Payer: BC Managed Care – PPO | Admitting: Cardiology

## 2022-02-04 VITALS — BP 118/80 | HR 91 | Ht 63.0 in | Wt 297.0 lb

## 2022-02-04 DIAGNOSIS — I10 Essential (primary) hypertension: Secondary | ICD-10-CM | POA: Diagnosis not present

## 2022-02-04 DIAGNOSIS — G4733 Obstructive sleep apnea (adult) (pediatric): Secondary | ICD-10-CM

## 2022-02-04 MED ORDER — FLUTICASONE PROPIONATE 50 MCG/ACT NA SUSP: 1.0000 | Freq: Every evening | NASAL | 2 refills | Status: AC

## 2022-02-04 MED ORDER — SALINE SPRAY 0.65 % NA SOLN: 2.0000 | Freq: Two times a day (BID) | NASAL | 0 refills | Status: AC

## 2022-02-04 NOTE — Patient Instructions (Signed)
Medication Instructions:  Your physician has recommended you make the following change in your medication: 1) START using Flonase - 1 spray in each nostril every night 2) START using nasal saline spray - two sprays in each nostril twice daily *If you need a refill on your cardiac medications before your next appointment, please call your pharmacy*  Follow-Up: At [facility], you and your health needs are our priority.  As part of our continuing mission to provide you with exceptional heart care, we have created designated Provider Care Teams.  These Care Teams include your primary Cardiologist (physician) and Advanced Practice Providers (APPs -  Physician Assistants and Nurse Practitioners) who all work together to provide you with the care you need, when you need it.  Your next appointment:   3 month(s)  The format for your next appointment:   In Person  Provider:   Armanda Magic, MD   Other Instructions Coralee North will call you with an appointment with your DME for a mask fitting  Important Information About Sugar

## 2022-02-04 NOTE — Addendum Note (Signed)
Addended by: Theresia Majors on: 02/04/2022 02:42 PM   Modules accepted: Orders

## 2022-02-04 NOTE — Progress Notes (Signed)
Sleep Medicine CONSULT Note    Date:  02/04/2022   ID:  Sonya Gilbert, DOB 1951/01/28, MRN [id number]  PCP:  [place], Betty G, MD  Cardiologist:  Armanda Magic, MD   Chief Complaint  Patient presents with   New Patient (Initial Visit)    Obstructive sleep apnea and hypertension    History of Present Illness:  Sonya Gilbert is a 71 y.o. female who is being seen today for the evaluation of OSA at the request of Verdis Prime, MD  This is a 71 year old female with a history of CKD, GERD, hypertension and palpitations who was referred for a home sleep study by Dr. Verdis Prime due to complaints of snoring, frequent atrial arrhythmias and morbid obesity.  She underwent home sleep study in June 2022 showing moderate obstructive sleep apnea with an AHI of 19/h and no significant central events.  O2 saturations were as low as 84%.  She was started on auto CPAP from 4 to 15 cm H2O and is now here for evaluation for obstructive sleep apnea.  Unfortunately she has not done well on the CPAP.  She usually can stay on it for about 2 hours and then has to take it off.  She tells me that her sinuses get sore and she gets restless and cannot sleep and takes it off.  She uses a nasal mask and has not tried a FFM.  She has tried Nasonex in the past but not consistently.  When she is on it she feels the pressure is adequate.    Past Medical History:  Diagnosis Date   Allergy    seasonal   Anemia    past hx    Arthritis    Asthma    Chicken pox    Chronic kidney disease    Family history of colon cancer in father    age 28 and sister age 43    GERD (gastroesophageal reflux disease)    History of frequent urinary tract infections    Hypertension    Obesity    Osteopenia    Peptic ulcer    history of PUD    Past Surgical History:  Procedure Laterality Date   ABDOMINAL HYSTERECTOMY  1996   BREAST SURGERY  2014   biopsy   COLONOSCOPY  2004   [facility]- normal    TONSILLECTOMY     At  72 yrs old    Current Medications: Current Meds  Medication Sig   albuterol (VENTOLIN HFA) 108 (90 Base) MCG/ACT inhaler Inhale 2 puffs into the lungs every 6 (six) hours as needed for wheezing or shortness of breath.   Cholecalciferol 100 MCG (4000 UT) CAPS Take 2,000 Units by mouth daily.   fluticasone-salmeterol (ADVAIR DISKUS) 250-50 MCG/ACT AEPB Inhale 1 puff into the lungs in the morning and at bedtime.   loratadine (CLARITIN) 10 MG tablet Take 10 mg by mouth daily.   metoprolol succinate (TOPROL-XL) 25 MG 24 hr tablet TAKE 1/2 TABLET BY MOUTH EVERY DAY (Patient taking differently: 25 mg.)   olmesartan-hydrochlorothiazide (BENICAR HCT) 20-12.5 MG tablet TAKE 1 TABLET BY MOUTH EVERY DAY   Current Facility-Administered Medications for the 02/04/22 encounter (Office Visit) with Quintella Reichert, MD  Medication   0.9 %  sodium chloride infusion    Allergies:   Codeine, Erythromycin, and Other   Social History   Socioeconomic History   Marital status: Widowed    Spouse name: Not on file   Number of children: Not  on file   Years of education: Not on file   Highest education level: Bachelor's degree (e.g., BA, AB, BS)  Occupational History   Not on file  Tobacco Use   Smoking status: Never   Smokeless tobacco: Never  Substance and Sexual Activity   Alcohol use: No    Alcohol/week: 1.0 standard drink of alcohol    Types: 1 Glasses of wine per week    Comment: 1 glass wine Q 3-4 months    Drug use: No   Sexual activity: Not on file  Other Topics Concern   Not on file  Social History Narrative   Not on file   Social Determinants of Health   Financial Resource Strain: Low Risk  (08/31/2021)   Overall Financial Resource Strain (CARDIA)    Difficulty of Paying Living Expenses: Not hard at all  Food Insecurity: No Food Insecurity (08/31/2021)   Hunger Vital Sign    Worried About Running Out of Food in the Last Year: Never true    Ran Out of Food in the Last Year: Never true   Transportation Needs: No Transportation Needs (08/31/2021)   PRAPARE - Administrator, Civil Service (Medical): No    Lack of Transportation (Non-Medical): No  Physical Activity: Insufficiently Active (08/31/2021)   Exercise Vital Sign    Days of Exercise per Week: 2 days    Minutes of Exercise per Session: 30 min  Stress: No Stress Concern Present (08/31/2021)   [employer] of Occupational Health - Occupational Stress Questionnaire    Feeling of Stress : Only a little  Social Connections: Moderately Integrated (08/31/2021)   Social Connection and Isolation Panel [NHANES]    Frequency of Communication with Friends and Family: More than three times a week    Frequency of Social Gatherings with Friends and Family: Once a week    Attends Religious Services: More than 4 times per year    Active Member of [employer] or Organizations: Yes    Attends Banker Meetings: More than 4 times per year    Marital Status: Widowed     Family History:  The patient's parent states pain Aurther Loft with family history includes Breast cancer in her sister; Cancer in her father, sister, and sister; Colon cancer (age of onset: 69) in her sister; Colon cancer (age of onset: 37) in her father; Colon polyps in her brother; Deep vein thrombosis in her mother and sister; Heart disease in her mother; Hypertension in her brother, mother, and sister.   ROS:   Please see the history of present illness.    ROS All other systems reviewed and are negative.     06/03/2019   11:05 AM  PAD Screen  Previous PAD dx? No  Previous surgical procedure? No  Pain with walking? No  Feet/toe relief with dangling? No  Painful, non-healing ulcers? No  Extremities discolored? Yes     PHYSICAL EXAM:   VS:  BP 118/80   Pulse 91   Ht 5\' 3"  (1.6 m)   Wt 297 lb (134.7 kg)   BMI 52.61 kg/m    GEN: Well nourished, well developed, in no acute distress  HEENT: normal  Neck: no JVD, carotid bruits, or  masses Cardiac: RRR; no murmurs, rubs, or gallops,no edema.  Intact distal pulses bilaterally.  Respiratory:  clear to auscultation bilaterally, normal work of breathing GI: soft, nontender, nondistended, + BS MS: no deformity or atrophy  Skin: warm and dry, no rash Neuro:  Alert and Oriented x 3, Strength and sensation are intact Psych: euthymic mood, full affect  Wt Readings from Last 3 Encounters:  02/04/22 297 lb (134.7 kg)  08/31/21 293 lb 4 oz (133 kg)  04/30/21 299 lb (135.6 kg)     Studies/Labs Reviewed:   Home sleep study and PAP compliance download  Recent Labs: 08/31/2021: ALT 11; BUN 17; Creatinine, Ser 0.90; Potassium 4.0; Sodium 139   Lipid Panel    Component Value Date/Time   CHOL 200 08/31/2021 0808   TRIG 67.0 08/31/2021 0808   HDL 77.80 08/31/2021 0808   CHOLHDL 3 08/31/2021 0808   VLDL 13.4 08/31/2021 0808   LDLCALC 109 (H) 08/31/2021 0808     Additional studies/ records that were reviewed today include:  none    ASSESSMENT:    1. OSA (obstructive sleep apnea)   2. Hypertension, essential, benign      PLAN:  In order of problems listed above:  OSA  -she has not been able to tolerate the nasal mask due to nasal congestion -she has only tried nasonex sporadically -she has not been compliant and only uses it about 2 hours when she tries to use it -I have recommended that she use nasal saline spray 2 sprays twice daily and Flonase 1 spray each nostril at bedtime>>if this does not improve sx then I will refer her to ENT -I will set up appt at DME to fit for a FFM -go back on CPAP at current settings for a goal of at least 5 hours nightly -followup with me in 3 months   HTN -BP controlled on exam today -Continue prescription drug management with Olmesartan-HCT 20-12.5mg  daily and Toprol XL 25mg  daily with PRn refills.   Time Spent: 20 minutes total time of encounter, including 15 minutes spent in face-to-face patient care on the date of this  encounter. This time includes coordination of care and counseling regarding above mentioned problem list. Remainder of non-face-to-face time involved reviewing chart documents/testing relevant to the patient encounter and documentation in the medical record. I have independently reviewed documentation from referring provider  Medication Adjustments/Labs and Tests Ordered: Current medicines are reviewed at length with the patient today.  Concerns regarding medicines are outlined above.  Medication changes, Labs and Tests ordered today are listed in the Patient Instructions below.  There are no Patient Instructions on file for this visit.   Signed, , MD  02/04/2022 2:31 PM    [facility] [address] [place], [place], [place]  [place] Phone: [phone]; Fax: [phone]

## 2022-02-05 ENCOUNTER — Telehealth: Payer: Self-pay | Admitting: *Deleted

## 2022-02-05 DIAGNOSIS — G4733 Obstructive sleep apnea (adult) (pediatric): Secondary | ICD-10-CM

## 2022-02-05 NOTE — Telephone Encounter (Signed)
Order placed to adapt via community message. 

## 2022-02-05 NOTE — Telephone Encounter (Signed)
-----   Message from Theresia Majors, RN sent at 02/04/2022  2:55 PM EDT ----- Please set patient up for an appointment with her DME for a full face mask fitting.  Thanks!

## 2022-02-08 ENCOUNTER — Telehealth: Payer: Self-pay | Admitting: *Deleted

## 2022-02-08 DIAGNOSIS — Z1231 Encounter for screening mammogram for malignant neoplasm of breast: Secondary | ICD-10-CM

## 2022-02-08 DIAGNOSIS — G4733 Obstructive sleep apnea (adult) (pediatric): Secondary | ICD-10-CM

## 2022-02-08 DIAGNOSIS — Z13820 Encounter for screening for osteoporosis: Secondary | ICD-10-CM

## 2022-02-08 NOTE — Telephone Encounter (Signed)
-----   Message from Carlyle Overbey, RN sent at 02/04/2022  2:55 PM EDT ----- Please set patient up for an appointment with her DME for a full face mask fitting.  Thanks!  

## 2022-02-08 NOTE — Telephone Encounter (Signed)
Order placed to adapt via community message. 

## 2022-02-09 NOTE — Telephone Encounter (Signed)
No documented mammogram on file. Per OV note from CPE 10/2020, pt does not recall ever having DEXA scan.  Pt denies hx of breast cancer, breast surgeries or current breast issue.  Scheduled for DEXA scan Aug 11 on mobile bus at 2pm with mammogram to follow.

## 2022-02-25 ENCOUNTER — Ambulatory Visit
Admission: RE | Admit: 2022-02-25 | Discharge: 2022-02-25 | Disposition: A | Discharge status: Home / Self Care | Payer: BC Managed Care – PPO | Source: Ambulatory Visit | Attending: Family Medicine | Admitting: Family Medicine

## 2022-02-25 ENCOUNTER — Inpatient Hospital Stay: Admission: RE | Admit: 2022-02-25 | Payer: BC Managed Care – PPO | Source: Ambulatory Visit

## 2022-02-25 DIAGNOSIS — Z1231 Encounter for screening mammogram for malignant neoplasm of breast: Secondary | ICD-10-CM

## 2022-03-29 ENCOUNTER — Other Ambulatory Visit (HOSPITAL_BASED_OUTPATIENT_CLINIC_OR_DEPARTMENT_OTHER): Payer: BC Managed Care – PPO | Admitting: Cardiology

## 2022-04-05 ENCOUNTER — Ambulatory Visit (HOSPITAL_BASED_OUTPATIENT_CLINIC_OR_DEPARTMENT_OTHER): Payer: BC Managed Care – PPO | Attending: Cardiology

## 2022-05-16 ENCOUNTER — Other Ambulatory Visit: Payer: Self-pay | Admitting: Interventional Cardiology

## 2022-05-16 ENCOUNTER — Other Ambulatory Visit: Payer: Self-pay | Admitting: Family Medicine

## 2022-05-16 DIAGNOSIS — I10 Essential (primary) hypertension: Secondary | ICD-10-CM

## 2022-05-16 DIAGNOSIS — J4520 Mild intermittent asthma, uncomplicated: Secondary | ICD-10-CM

## 2022-05-24 ENCOUNTER — Ambulatory Visit: Payer: BC Managed Care – PPO | Admitting: Cardiology

## 2022-06-01 ENCOUNTER — Ambulatory Visit: Payer: BC Managed Care – PPO | Attending: Cardiology | Admitting: Cardiology

## 2022-06-01 ENCOUNTER — Encounter: Payer: Self-pay | Admitting: Cardiology

## 2022-06-01 VITALS — BP 138/78 | HR 95 | Ht 63.0 in | Wt 300.2 lb

## 2022-06-01 DIAGNOSIS — I10 Essential (primary) hypertension: Secondary | ICD-10-CM | POA: Diagnosis not present

## 2022-06-01 DIAGNOSIS — G4733 Obstructive sleep apnea (adult) (pediatric): Secondary | ICD-10-CM

## 2022-06-01 NOTE — Progress Notes (Signed)
Sleep Medicine Note    Date:  06/01/2022   ID:  ARNELL PORATH, DOB 02-09-1951, MRN [id number]  PCP:  Martinique, Betty G, MD  Cardiologist:  Fransico Him, MD   Chief Complaint  Patient presents with   Sleep Apnea   Hypertension    History of Present Illness:  Sonya Gilbert is a 71 y.o. female  with a history of CKD, GERD, hypertension and palpitations who was referred for a home sleep study by Dr. Daneen Schick due to complaints of snoring, frequent atrial arrhythmias and morbid obesity.  She underwent home sleep study in June 2022 showing moderate obstructive sleep apnea with an AHI of 19/h and no significant central events.  O2 saturations were as low as 84%.  She was started on auto CPAP from 4 to 15 cm H2O.  Unfortunately at last office visit she was having a lot of problems with her CPAP.  She was only able to she usually can stay on it for about 2 hours and then had to take it off.  She said that her sinuses get sore and she gets restless and cannot sleep and takes it off.  She uses a nasal mask.  I started her on Flonase as well as nasal saline spray and recommended ENT referral if her symptoms did not improve.  She was encouraged to go back on her CPAP.  She was sent for a mask fitting to the DME.  She she went back to the DME and her mask fitting is now using a fullface mask which she likes a lot better.  She says that she has not been using it though but she says it is more just her mentally as she feels like the full facemask is well-tolerated.  She plans on trying to start the CPAP and be compliant with that. Past Medical History:  Diagnosis Date   Allergy    seasonal   Anemia    past hx    Arthritis    Asthma    Chicken pox    Chronic kidney disease    Family history of colon cancer in father    age 16 and sister age 77    GERD (gastroesophageal reflux disease)    History of frequent urinary tract infections    Hypertension    Obesity    Osteopenia    Peptic ulcer     history of PUD    Past Surgical History:  Procedure Laterality Date   ABDOMINAL HYSTERECTOMY  1996   BREAST BIOPSY Right    BREAST SURGERY  2014   biopsy   COLONOSCOPY  2004   [facility]- normal    TONSILLECTOMY     At 71 yrs old    Current Medications: No outpatient medications have been marked as taking for the 06/01/22 encounter (Office Visit) with Sonya Margarita, MD.   Current Facility-Administered Medications for the 06/01/22 encounter (Office Visit) with Sonya Margarita, MD  Medication   0.9 %  sodium chloride infusion    Allergies:   Codeine, Erythromycin, and Other   Social History   Socioeconomic History   Marital status: Widowed    Spouse name: Not on file   Number of children: Not on file   Years of education: Not on file   Highest education level: Bachelor's degree (e.g., BA, AB, BS)  Occupational History   Not on file  Tobacco Use   Smoking status: Never   Smokeless tobacco: Never  Substance  and Sexual Activity   Alcohol use: No    Alcohol/week: 1.0 standard drink of alcohol    Types: 1 Glasses of wine per week    Comment: 1 glass wine Q 3-4 months    Drug use: No   Sexual activity: Not on file  Other Topics Concern   Not on file  Social History Narrative   Not on file   Social Determinants of Health   Financial Resource Strain: Low Risk  (08/31/2021)   Overall Financial Resource Strain (CARDIA)    Difficulty of Paying Living Expenses: Not hard at all  Food Insecurity: No Food Insecurity (08/31/2021)   Hunger Vital Sign    Worried About Running Out of Food in the Last Year: Never true    Ran Out of Food in the Last Year: Never true  Transportation Needs: No Transportation Needs (08/31/2021)   PRAPARE - Administrator, Civil Service (Medical): No    Lack of Transportation (Non-Medical): No  Physical Activity: Insufficiently Active (08/31/2021)   Exercise Vital Sign    Days of Exercise per Week: 2 days    Minutes of Exercise  per Session: 30 min  Stress: No Stress Concern Present (08/31/2021)   [employer] of Occupational Health - Occupational Stress Questionnaire    Feeling of Stress : Only a little  Social Connections: Moderately Integrated (08/31/2021)   Social Connection and Isolation Panel [NHANES]    Frequency of Communication with Friends and Family: More than three times a week    Frequency of Social Gatherings with Friends and Family: Once a week    Attends Religious Services: More than 4 times per year    Active Member of [employer] or Organizations: Yes    Attends Banker Meetings: More than 4 times per year    Marital Status: Widowed     Family History:  The patient's parent states pain Aurther Loft with family history includes Breast cancer in her sister; Cancer in her father, sister, and sister; Colon cancer (age of onset: 33) in her sister; Colon cancer (age of onset: 35) in her father; Colon polyps in her brother; Deep vein thrombosis in her mother and sister; Heart disease in her mother; Hypertension in her brother, mother, and sister.   ROS:   Please see the history of present illness.    ROS All other systems reviewed and are negative.     06/03/2019   11:05 AM  PAD Screen  Previous PAD dx? No  Previous surgical procedure? No  Pain with walking? No  Feet/toe relief with dangling? No  Painful, non-healing ulcers? No  Extremities discolored? Yes     PHYSICAL EXAM:   VS:  There were no vitals taken for this visit.   GEN: Well nourished, well developed in no acute distress HEENT: Normal NECK: No JVD; No carotid bruits LYMPHATICS: No lymphadenopathy CARDIAC:RRR, no murmurs, rubs, gallops RESPIRATORY:  Clear to auscultation without rales, wheezing or rhonchi  ABDOMEN: Soft, non-tender, non-distended MUSCULOSKELETAL:  No edema; No deformity  SKIN: Warm and dry NEUROLOGIC:  Alert and oriented x 3 PSYCHIATRIC:  Normal affect  Wt Readings from Last 3 Encounters:  02/04/22 297  lb (134.7 kg)  08/31/21 293 lb 4 oz (133 kg)  04/30/21 299 lb (135.6 kg)     Studies/Labs Reviewed:   Home sleep study and PAP compliance download  Recent Labs: 08/31/2021: ALT 11; BUN 17; Creatinine, Ser 0.90; Potassium 4.0; Sodium 139   Lipid Panel  Component Value Date/Time   CHOL 200 08/31/2021 0808   TRIG 67.0 08/31/2021 0808   HDL 77.80 08/31/2021 0808   CHOLHDL 3 08/31/2021 0808   VLDL 13.4 08/31/2021 0808   LDLCALC 109 (H) 08/31/2021 0808     Additional studies/ records that were reviewed today include:  none    ASSESSMENT:    1. OSA (obstructive sleep apnea)   2. Hypertension, essential, benign      PLAN:  In order of problems listed above:  OSA  -She switched to a full facemask after a DME visit and tolerates that much better.  She has not though been compliant with using her CPAP as she has had some issues getting to sleep but those have improved after she adjusted her sleep number bed. -She is to start using her CPAP as she knows it will benefit her from a cardiac standpoint and her overall health. -She is not a candidate for the inspire device as her BMI is 53   HTN -BP is adequately controlled on exam today -Continue prescription drug management olmesartan HCT 20-12.5 mg daily Toprol-XL 25 mg daily with as needed refills  Time Spent: 20 minutes total time of encounter, including 15 minutes spent in face-to-face patient care on the date of this encounter. This time includes coordination of care and counseling regarding above mentioned problem list. Remainder of non-face-to-face time involved reviewing chart documents/testing relevant to the patient encounter and documentation in the medical record. I have independently reviewed documentation from referring provider  Medication Adjustments/Labs and Tests Ordered: Current medicines are reviewed at length with the patient today.  Concerns regarding medicines are outlined above.  Medication changes, Labs  and Tests ordered today are listed in the Patient Instructions below.  There are no Patient Instructions on file for this visit.   Signed, Fransico Him, MD  06/01/2022 11:08 AM    [address], [place], [place]  [address] Phone: [phone]; Fax: [phone]

## 2022-06-01 NOTE — Patient Instructions (Signed)
Medication Instructions:  Your physician recommends that you continue on your current medications as directed. Please refer to the Current Medication list given to you today.  *If you need a refill on your cardiac medications before your next appointment, please call your pharmacy*  Follow-Up: At [facility], you and your health needs are our priority.  As part of our continuing mission to provide you with exceptional heart care, we have created designated Provider Care Teams.  These Care Teams include your primary Cardiologist (physician) and Advanced Practice Providers (APPs -  Physician Assistants and Nurse Practitioners) who all work together to provide you with the care you need, when you need it.  Your next appointment:   3 month(s)  The format for your next appointment:   In Person  Provider:   Traci Turner, MD    Important Information About Sugar       

## 2022-06-15 ENCOUNTER — Encounter: Payer: Self-pay | Admitting: Family Medicine

## 2022-06-15 ENCOUNTER — Telehealth (INDEPENDENT_AMBULATORY_CARE_PROVIDER_SITE_OTHER): Payer: BC Managed Care – PPO | Admitting: Family Medicine

## 2022-06-15 VITALS — Ht 63.0 in

## 2022-06-15 DIAGNOSIS — J4520 Mild intermittent asthma, uncomplicated: Secondary | ICD-10-CM | POA: Diagnosis not present

## 2022-06-15 DIAGNOSIS — R0981 Nasal congestion: Secondary | ICD-10-CM | POA: Diagnosis not present

## 2022-06-15 DIAGNOSIS — H1013 Acute atopic conjunctivitis, bilateral: Secondary | ICD-10-CM | POA: Diagnosis not present

## 2022-06-15 DIAGNOSIS — J069 Acute upper respiratory infection, unspecified: Secondary | ICD-10-CM | POA: Diagnosis not present

## 2022-06-15 MED ORDER — AMOXICILLIN-POT CLAVULANATE 875-125 MG PO TABS: 1.0000 | ORAL_TABLET | Freq: Two times a day (BID) | ORAL | 0 refills | Status: DC

## 2022-06-15 MED ORDER — OLOPATADINE HCL 0.2 % OP SOLN: 1.0000 [drp] | Freq: Every day | OPHTHALMIC | 0 refills | Status: DC | PRN

## 2022-06-15 MED ORDER — PREDNISONE 20 MG PO TABS: 40.0000 mg | ORAL_TABLET | Freq: Every day | ORAL | 0 refills | Status: AC

## 2022-06-15 MED ORDER — BENZONATATE 100 MG PO CAPS: 200.0000 mg | ORAL_CAPSULE | Freq: Two times a day (BID) | ORAL | 0 refills | Status: AC | PRN

## 2022-06-15 NOTE — Progress Notes (Signed)
Virtual Visit via Video Note I connected with Sonya Gilbert on 06/15/22 by a video enabled telemedicine application and verified that I am speaking with the correct person using two identifiers. Location patient: home Location provider:work office Persons participating in the virtual visit: patient, provider  I discussed the limitations of evaluation and management by telemedicine and the availability of in person appointments. The patient expressed understanding and agreed to proceed.  Chief Complaint  Patient presents with   Sinus Problem   HPI: Sonya Gilbert is a 71 year old female with history of seasonal allergies, OA, CKD, OSA, GERD, and hypertension complaining of sinus issues for almost a week. She reports experiencing significant nasal congestion, with blood and yellow mucus upon blowing her nose. She has been applying Vaseline to her nose to slow down the bleeding, which she believes is caused by dry nasal mucosa. She denies having fever or chills.  She mentions that her right eye becomes matted in the mornings, requiring cold water to remove the matting.  It gets better throughout the day, she is not having conjunctival erythema or visual changes.  She has been taking Robitussin, decongestant, Mucinex, vitamin C, vitamin D, her "allergy medicines", and increased fluid intake, including hot tea and lemon ginger tea.   She experiences frontal headaches and sinus pressure, which are relieved by applying a cold towel to her nose. She also describes coughing with thick sputum, particularly at night, but denies hemoptysis.   Negative for dyspnea, CP, wheezing.  She is currently on Advair 250-50 mcg and albuterol inhaler, she does not use Advair daily. Negative for sore throat, abdominal pain, nausea, vomiting, changes in bowel habits, urinary symptoms, body aches, or a skin rash.  She reports that her son has been experiencing similar symptoms and was recently prescribed antibiotics.   Her son was screened for COVID-19 infection, it was negative.  She has not done a COVID 19 home test herself.  ROS: See pertinent positives and negatives per HPI.  Past Medical History:  Diagnosis Date   Allergy    seasonal   Anemia    past hx    Arthritis    Asthma    Chicken pox    Chronic kidney disease    Family history of colon cancer in father    age 5 and sister age 79    GERD (gastroesophageal reflux disease)    History of frequent urinary tract infections    Hypertension    Obesity    Osteopenia    Peptic ulcer    history of PUD    Past Surgical History:  Procedure Laterality Date   ABDOMINAL HYSTERECTOMY  1996   BREAST BIOPSY Right    BREAST SURGERY  2014   biopsy   COLONOSCOPY  2004   [facility]- normal    TONSILLECTOMY     At 71 yrs old    Family History  Problem Relation Age of Onset   Cancer Sister        breast   Hypertension Sister    Deep vein thrombosis Sister    Breast cancer Sister    Heart disease Mother        CHF   Hypertension Mother    Deep vein thrombosis Mother    Cancer Father        Colon   Colon cancer Father 79   Cancer Sister        Colon   Colon cancer Sister 50   Hypertension Brother  Colon polyps Brother    Esophageal cancer Neg Hx    Rectal cancer Neg Hx    Stomach cancer Neg Hx     Social History   Socioeconomic History   Marital status: Widowed    Spouse name: Not on file   Number of children: Not on file   Years of education: Not on file   Highest education level: Bachelor's degree (e.g., BA, AB, BS)  Occupational History   Not on file  Tobacco Use   Smoking status: Never   Smokeless tobacco: Never  Substance and Sexual Activity   Alcohol use: No    Alcohol/week: 1.0 standard drink of alcohol    Types: 1 Glasses of wine per week    Comment: 1 glass wine Q 3-4 months    Drug use: No   Sexual activity: Not on file  Other Topics Concern   Not on file  Social History Narrative   Not on file    Social Determinants of Health   Financial Resource Strain: Low Risk  (08/31/2021)   Overall Financial Resource Strain (CARDIA)    Difficulty of Paying Living Expenses: Not hard at all  Food Insecurity: No Food Insecurity (08/31/2021)   Hunger Vital Sign    Worried About Running Out of Food in the Last Year: Never true    Ran Out of Food in the Last Year: Never true  Transportation Needs: No Transportation Needs (08/31/2021)   PRAPARE - Administrator, Civil Service (Medical): No    Lack of Transportation (Non-Medical): No  Physical Activity: Insufficiently Active (08/31/2021)   Exercise Vital Sign    Days of Exercise per Week: 2 days    Minutes of Exercise per Session: 30 min  Stress: No Stress Concern Present (08/31/2021)   [employer] of Occupational Health - Occupational Stress Questionnaire    Feeling of Stress : Only a little  Social Connections: Moderately Integrated (08/31/2021)   Social Connection and Isolation Panel [NHANES]    Frequency of Communication with Friends and Family: More than three times a week    Frequency of Social Gatherings with Friends and Family: Once a week    Attends Religious Services: More than 4 times per year    Active Member of [employer] or Organizations: Yes    Attends Banker Meetings: More than 4 times per year    Marital Status: Widowed  Catering manager Violence: Not on file    Current Outpatient Medications:    albuterol (VENTOLIN HFA) 108 (90 Base) MCG/ACT inhaler, Inhale 2 puffs into the lungs every 6 (six) hours as needed for wheezing or shortness of breath., Disp: 18 g, Rfl: 3   amoxicillin-clavulanate (AUGMENTIN) 875-125 MG tablet, Take 1 tablet by mouth 2 (two) times daily., Disp: 20 tablet, Rfl: 0   benzonatate (TESSALON) 100 MG capsule, Take 2 capsules (200 mg total) by mouth 2 (two) times daily as needed for up to 10 days., Disp: 30 capsule, Rfl: 0   Cholecalciferol 100 MCG (4000 UT) CAPS, Take 2,000 Units by  mouth daily., Disp: , Rfl:    fluticasone (FLONASE) 50 MCG/ACT nasal spray, Place 1 spray into both nostrils at bedtime., Disp: 18.2 mL, Rfl: 2   fluticasone-salmeterol (ADVAIR DISKUS) 250-50 MCG/ACT AEPB, Inhale 1 puff into the lungs in the morning and at bedtime., Disp: 60 each, Rfl: 6   loratadine (CLARITIN) 10 MG tablet, Take 10 mg by mouth daily., Disp: , Rfl:    metoprolol succinate (TOPROL-XL) 25  MG 24 hr tablet, TAKE 1 TABLET (25 MG TOTAL) BY MOUTH DAILY., Disp: 90 tablet, Rfl: 2   olmesartan-hydrochlorothiazide (BENICAR HCT) 20-12.5 MG tablet, TAKE 1 TABLET BY MOUTH EVERY DAY, Disp: 90 tablet, Rfl: 0   Olopatadine HCl ([facility]S OLOPATADINE HCL) 0.2 % SOLN, Apply 1 drop to eye daily as needed., Disp: 2.5 mL, Rfl: 0   predniSONE (DELTASONE) 20 MG tablet, Take 2 tablets (40 mg total) by mouth daily with breakfast for 3 days., Disp: 6 tablet, Rfl: 0   sodium chloride (OCEAN) 0.65 % SOLN nasal spray, Place 2 sprays into both nostrils in the morning and at bedtime., Disp: , Rfl: 0  EXAM:  VITALS per patient if applicable:Ht 5\' 3"  (1.6 m)   BMI 53.18 kg/m   GENERAL: alert, oriented, appears well and in no acute distress  HEENT: atraumatic, conjunctiva clear, no obvious abnormalities on inspection of external nose and ears Bilateral epiphora.  NECK: normal movements of the head and neck  LUNGS: on inspection no signs of respiratory distress, breathing rate appears normal, no obvious gross SOB, gasping or wheezing Productive cough a few times during visit.  CV: no obvious cyanosis  MS: moves all visible extremities without noticeable abnormality  PSYCH/NEURO: pleasant and cooperative, no obvious depression or anxiety, speech and thought processing grossly intact  ASSESSMENT AND PLAN:  Discussed the following assessment and plan:  Nasal sinus congestion - Plan: predniSONE (DELTASONE) 20 MG tablet Explained that symptoms could be related to viral illness and allergies, I do not think  antibiotic treatment is needed at this time. She has taken prednisone in the past, well-tolerated, she agrees with short course of prednisone, 40 mg with breakfast for 3 days. Sent prescription for Augmentin to her pharmacy to consider taking in 3 to 4 days if symptoms are not any better.  We discussed some side effects of medications, she understands will not help if there is not a bacterial etiology. Nasal saline irrigations as needed also recommended.  Other orders -     Amoxicillin-Pot Clavulanate; Take 1 tablet by mouth 2 (two) times daily.  Dispense: 20 tablet; Refill: 0  URI, acute - Plan: benzonatate (TESSALON) 100 MG capsule Symptoms suggests a viral etiology, so symptomatic treatment recommended. Instructed to monitor for signs of complications, including new onset of fever among some. I also explained that cough and nasal congestion can last a few days and sometimes weeks. Plain Mucinex recommended. F/U as needed.  Asthma in adult, mild intermittent, uncomplicated She is not having an acute exacerbation at this time. Recommend taking Advair 250-50 mcg twice daily daily instead as needed. Continue albuterol inhaler 2 puffs every 4-6 hours as needed. Instructed about warning signs.  Allergic conjunctivitis of both eyes - Plan: Olopatadine HCl (CVS OLOPATADINE HCL) 0.2 % SOLN Olopatadine eyedrops recommended, 1 drop in each eye daily as needed. Monitor for signs of infection.  We discussed possible serious and likely etiologies, options for evaluation and workup, limitations of telemedicine visit vs in person visit, treatment, treatment risks and precautions. The patient was advised to call back or seek an in-person evaluation if the symptoms worsen or if the condition fails to improve as anticipated. I discussed the assessment and treatment plan with the patient. The patient was provided an opportunity to ask questions and all were answered. The patient agreed with the plan and  demonstrated an understanding of the instructions.  Kiptyn Rafuse G. , MD  [facility]. [place] office.

## 2022-08-11 ENCOUNTER — Other Ambulatory Visit: Payer: Self-pay | Admitting: Family Medicine

## 2022-08-11 DIAGNOSIS — J4520 Mild intermittent asthma, uncomplicated: Secondary | ICD-10-CM

## 2022-08-11 DIAGNOSIS — I10 Essential (primary) hypertension: Secondary | ICD-10-CM

## 2022-08-22 NOTE — Progress Notes (Signed)
ACUTE VISIT No chief complaint on file.  HPI: [name] is a 72 y.o. female, who is here today complaining of *** HPI  Review of Systems See other pertinent positives and negatives in HPI.  Current Outpatient Medications on File Prior to Visit  Medication Sig Dispense Refill   albuterol (VENTOLIN HFA) 108 (90 Base) MCG/ACT inhaler Inhale 2 puffs into the lungs every 6 (six) hours as needed for wheezing or shortness of breath. 18 g 3   amoxicillin-clavulanate (AUGMENTIN) 875-125 MG tablet Take 1 tablet by mouth 2 (two) times daily. 20 tablet 0   Cholecalciferol 100 MCG (4000 UT) CAPS Take 2,000 Units by mouth daily.     fluticasone (FLONASE) 50 MCG/ACT nasal spray Place 1 spray into both nostrils at bedtime. 18.2 mL 2   fluticasone-salmeterol (ADVAIR DISKUS) 250-50 MCG/ACT AEPB Inhale 1 puff into the lungs in the morning and at bedtime. 60 each 6   loratadine (CLARITIN) 10 MG tablet Take 10 mg by mouth daily.     metoprolol succinate (TOPROL-XL) 25 MG 24 hr tablet TAKE 1 TABLET (25 MG TOTAL) BY MOUTH DAILY. 90 tablet 2   olmesartan-hydrochlorothiazide (BENICAR HCT) 20-12.5 MG tablet Take 1 tablet by mouth daily. SCHEDULE ANNUAL APPT FOR FUTURE REFILLS 90 tablet 0   Olopatadine HCl (CVS OLOPATADINE HCL) 0.2 % SOLN Apply 1 drop to eye daily as needed. 2.5 mL 0   sodium chloride (OCEAN) 0.65 % SOLN nasal spray Place 2 sprays into both nostrils in the morning and at bedtime.  0   No current facility-administered medications on file prior to visit.    Past Medical History:  Diagnosis Date   Allergy    seasonal   Anemia    past hx    Arthritis    Asthma    Chicken pox    Chronic kidney disease    Family history of colon cancer in father    age 60 and sister age 23    GERD (gastroesophageal reflux disease)    History of frequent urinary tract infections    Hypertension    Obesity    Osteopenia    Peptic ulcer    history of PUD   Allergies  Allergen Reactions    Codeine Palpitations   Erythromycin Hives and Nausea And Vomiting   Other     Bees, mold, grass, cats     Social History   Socioeconomic History   Marital status: Widowed    Spouse name: Not on file   Number of children: Not on file   Years of education: Not on file   Highest education level: Bachelor's degree (e.g., BA, AB, BS)  Occupational History   Not on file  Tobacco Use   Smoking status: Never   Smokeless tobacco: Never  Substance and Sexual Activity   Alcohol use: No    Alcohol/week: 1.0 standard drink of alcohol    Types: 1 Glasses of wine per week    Comment: 1 glass wine Q 3-4 months    Drug use: No   Sexual activity: Not on file  Other Topics Concern   Not on file  Social History Narrative   Not on file   Social Determinants of Health   Financial Resource Strain: Low Risk  (08/31/2021)   Overall Financial Resource Strain (CARDIA)    Difficulty of Paying Living Expenses: Not hard at all  Food Insecurity: No Food Insecurity (08/31/2021)   Hunger Vital Sign    Worried About Running  Out of Food in the Last Year: Never true    Ran Out of Food in the Last Year: Never true  Transportation Needs: No Transportation Needs (08/31/2021)   PRAPARE - Hydrologist (Medical): No    Lack of Transportation (Non-Medical): No  Physical Activity: Insufficiently Active (08/31/2021)   Exercise Vital Sign    Days of Exercise per Week: 2 days    Minutes of Exercise per Session: 30 min  Stress: No Stress Concern Present (08/31/2021)   [place]    Feeling of Stress : Only a little  Social Connections: Moderately Integrated (08/31/2021)   Social Connection and Isolation Panel [NHANES]    Frequency of Communication with Friends and Family: More than three times a week    Frequency of Social Gatherings with Friends and Family: Once a week    Attends Religious Services: More than 4 times per  year    Active Member of [employer] or Organizations: Yes    Attends Archivist Meetings: More than 4 times per year    Marital Status: Widowed    There were no vitals filed for this visit. There is no height or weight on file to calculate BMI.  Physical Exam  ASSESSMENT AND PLAN: There are no diagnoses linked to this encounter.  No follow-ups on file.  Mell Mellott G. Martinique, MD  [facility]. [place] office.  Discharge Instructions   None

## 2022-08-23 ENCOUNTER — Ambulatory Visit (INDEPENDENT_AMBULATORY_CARE_PROVIDER_SITE_OTHER): Payer: BC Managed Care – PPO | Admitting: Family Medicine

## 2022-08-23 ENCOUNTER — Encounter: Payer: Self-pay | Admitting: Family Medicine

## 2022-08-23 VITALS — BP 130/80 | HR 82 | Temp 98.0°F | Resp 16 | Ht 63.0 in | Wt 289.0 lb

## 2022-08-23 DIAGNOSIS — J4520 Mild intermittent asthma, uncomplicated: Secondary | ICD-10-CM | POA: Diagnosis not present

## 2022-08-23 DIAGNOSIS — J069 Acute upper respiratory infection, unspecified: Secondary | ICD-10-CM | POA: Diagnosis not present

## 2022-08-23 LAB — BASIC METABOLIC PANEL
BUN: 12 mg/dL (ref 6–23)
CO2: 21 mEq/L (ref 19–32)
Calcium: 8.8 mg/dL (ref 8.4–10.5)
Chloride: 104 mEq/L (ref 96–112)
Creatinine, Ser: 0.88 mg/dL (ref 0.40–1.20)
GFR: 67.05 mL/min (ref >60.00)
Glucose, Bld: 104 mg/dL — ABNORMAL HIGH (ref 70–99)
Potassium: 3.6 mEq/L (ref 3.5–5.1)
Sodium: 141 mEq/L (ref 135–145)

## 2022-08-23 LAB — CBC
HCT: 42.8 % (ref 36.0–46.0)
Hemoglobin: 14.4 g/dL (ref 12.0–15.0)
MCHC: 33.6 g/dL (ref 30.0–36.0)
MCV: 82.8 fl (ref 78.0–100.0)
Platelets: 231 10*3/uL (ref 150.0–400.0)
RBC: 5.17 Mil/uL — ABNORMAL HIGH (ref 3.87–5.11)
RDW: 14.2 % (ref 11.5–15.5)
WBC: 4.6 10*3/uL (ref 4.0–10.5)

## 2022-08-23 MED ORDER — ALBUTEROL SULFATE HFA 108 (90 BASE) MCG/ACT IN AERS: 2.0000 | INHALATION_SPRAY | Freq: Four times a day (QID) | RESPIRATORY_TRACT | 3 refills | Status: DC | PRN

## 2022-08-23 MED ORDER — FLUTICASONE-SALMETEROL 250-50 MCG/ACT IN AEPB: 1.0000 | INHALATION_SPRAY | Freq: Two times a day (BID) | RESPIRATORY_TRACT | 6 refills | Status: DC

## 2022-08-23 MED ORDER — PREDNISONE 20 MG PO TABS: 40.0000 mg | ORAL_TABLET | Freq: Every day | ORAL | 0 refills | Status: AC

## 2022-08-23 NOTE — Patient Instructions (Addendum)
A few things to remember from today's visit:  Asthma in adult, mild intermittent, uncomplicated - Plan: predniSONE (DELTASONE) 20 MG tablet, albuterol (VENTOLIN HFA) 108 (90 Base) MCG/ACT inhaler, fluticasone-salmeterol (ADVAIR DISKUS) 250-50 MCG/ACT AEPB  URI, acute - Plan: CBC, Basic metabolic panel  Lungs are clear today but because you have had wheezing start Prednisone with breakfast for 3 days. Albuterol inh 2 puff every 6 hours for a week then as needed for wheezing or shortness of breath.  Resume Advair. Monitor for fever. Advance diet as tolerated.  If you need refills for medications you take chronically, please call your pharmacy. Do not use My Chart to request refills or for acute issues that need immediate attention. If you send a my chart message, it may take a few days to be addressed, specially if I am not in the office.  Please be sure medication list is accurate. If a new problem present, please set up appointment sooner than planned today.

## 2022-08-23 NOTE — Assessment & Plan Note (Addendum)
Lung auscultation negative today, I do not think imaging is needed. Prednisone 40 mg daily with breakfast for 3 days, she has tolerated medication well in the past. Albuterol inh 2 puff every 6 hours for a week then as needed for wheezing or shortness of breath.  Resume Advair 250-50 mcg bid. Clearly instructed about warning signs. F/U in 4 weeks, before if needed.

## 2022-09-12 ENCOUNTER — Ambulatory Visit: Payer: BC Managed Care – PPO | Admitting: Cardiology

## 2022-09-15 ENCOUNTER — Encounter: Payer: Self-pay | Admitting: Internal Medicine

## 2022-09-20 NOTE — Progress Notes (Signed)
HPI: [name] is a 72 y.o. female with PMHx significant for asthma, hypertension, OSA, seasonal allergies, and GERD here today to follow on recent visit. She was last seen on 08/23/2022, when she was having an asthma exacerbation. Advair 250-50 mcg 1 puff twice daily was resumed. She is also on albuterol inhaler 2 puff every 4-6 hours as needed. She completed prednisone treatment. She reports great improvement of symptoms but she still experiences occasional wheezing when engaging in activities such as walking and continues to cough, albeit less frequently. She mentions that her body is sore from coughing.  Allergy rhinitis, for which she is also using Flonase for nasal spray and taking Loratadine 10 mg. She used to be on immunotherapy treatment, twice per week, she has not followed with immunologist in years.  Regarding her sleep apnea, she recalls her last sleep study was conducted in either April or July/2023. She is currently under the care of Dr. Radford Pax for sleep apnea management and struggles with using the CPAP machine, which she is not using as instructed,she has difficulty keeping the mask on.  Hypertension: Currently she is on olmesartan-HCTZ 20-12.5 mg daily and metoprolol succinate 25 mg daily. She reports a blood pressure reading of 120/80 today. Negative for CP, palpitations, PND, orthopnea, focal neurologic deficit, or worsening LE edema. Lab Results  Component Value Date   CREATININE 0.88 08/23/2022   BUN 12 08/23/2022   NA 141 08/23/2022   K 3.6 08/23/2022   CL 104 08/23/2022   CO2 21 08/23/2022  PAD: She is not on a statin medication. Negative for claudication-like symptoms. Lab Results  Component Value Date   CHOL 200 08/31/2021   HDL 77.80 08/31/2021   LDLCALC 109 (H) 08/31/2021   TRIG 67.0 08/31/2021   CHOLHDL 3 08/31/2021   Review of Systems  Constitutional:  Positive for fatigue. Negative for chills and fever.  HENT:  Positive for congestion,  postnasal drip and rhinorrhea. Negative for nosebleeds and sore throat.   Eyes:  Negative for redness and visual disturbance.  Gastrointestinal:  Negative for abdominal pain, nausea and vomiting.  Genitourinary:  Negative for decreased urine volume and hematuria.  Skin:  Negative for rash.  Neurological:  Negative for syncope and headaches.  See other pertinent positives and negatives in HPI.  Current Outpatient Medications on File Prior to Visit  Medication Sig Dispense Refill   Cholecalciferol 100 MCG (4000 UT) CAPS Take 2,000 Units by mouth daily.     fluticasone (FLONASE) 50 MCG/ACT nasal spray Place 1 spray into both nostrils at bedtime. 18.2 mL 2   fluticasone-salmeterol (ADVAIR DISKUS) 250-50 MCG/ACT AEPB Inhale 1 puff into the lungs in the morning and at bedtime. 60 each 6   loratadine (CLARITIN) 10 MG tablet Take 10 mg by mouth daily.     metoprolol succinate (TOPROL-XL) 25 MG 24 hr tablet TAKE 1 TABLET (25 MG TOTAL) BY MOUTH DAILY. 90 tablet 2   Olopatadine HCl (CVS OLOPATADINE HCL) 0.2 % SOLN Apply 1 drop to eye daily as needed. 2.5 mL 0   sodium chloride (OCEAN) 0.65 % SOLN nasal spray Place 2 sprays into both nostrils in the morning and at bedtime.  0   No current facility-administered medications on file prior to visit.   Past Medical History:  Diagnosis Date   Allergy    seasonal   Anemia    past hx    Arthritis    Asthma    Chicken pox    Chronic kidney disease  Family history of colon cancer in father    age 68 and sister age 38    GERD (gastroesophageal reflux disease)    History of frequent urinary tract infections    Hypertension    Obesity    Osteopenia    Peptic ulcer    history of PUD   Allergies  Allergen Reactions   Codeine Palpitations   Erythromycin Hives and Nausea And Vomiting   Other     Bees, mold, grass, cats     Social History   Socioeconomic History   Marital status: Widowed    Spouse name: Not on file   Number of children: Not  on file   Years of education: Not on file   Highest education level: Bachelor's degree (e.g., BA, AB, BS)  Occupational History   Not on file  Tobacco Use   Smoking status: Never   Smokeless tobacco: Never  Substance and Sexual Activity   Alcohol use: No    Alcohol/week: 1.0 standard drink of alcohol    Types: 1 Glasses of wine per week    Comment: 1 glass wine Q 3-4 months    Drug use: No   Sexual activity: Not on file  Other Topics Concern   Not on file  Social History Narrative   Not on file   Social Determinants of Health   Financial Resource Strain: Low Risk  (09/21/2022)   Overall Financial Resource Strain (CARDIA)    Difficulty of Paying Living Expenses: Not very hard  Food Insecurity: No Food Insecurity (09/21/2022)   Hunger Vital Sign    Worried About Running Out of Food in the Last Year: Never true    Ran Out of Food in the Last Year: Never true  Transportation Needs: No Transportation Needs (09/21/2022)   PRAPARE - Hydrologist (Medical): No    Lack of Transportation (Non-Medical): No  Physical Activity: Unknown (09/21/2022)   Exercise Vital Sign    Days of Exercise per Week: 0 days    Minutes of Exercise per Session: Not on file  Stress: No Stress Concern Present (09/21/2022)   [place]    Feeling of Stress : Not at all  Social Connections: Moderately Integrated (09/21/2022)   Social Connection and Isolation Panel [NHANES]    Frequency of Communication with Friends and Family: More than three times a week    Frequency of Social Gatherings with Friends and Family: Twice a week    Attends Religious Services: More than 4 times per year    Active Member of [employer] or Organizations: Yes    Attends Archivist Meetings: More than 4 times per year    Marital Status: Widowed   Vitals:   09/21/22 0731  BP: 128/80  Pulse: 96  Resp: 16  Temp: 98.3 F (36.8 C)  SpO2: 98%    Body mass index is 53.32 kg/m.  Physical Exam Vitals and nursing note reviewed.  Constitutional:      General: She is not in acute distress.    Appearance: She is well-developed.  HENT:     Head: Normocephalic and atraumatic.     Nose:     Right Turbinates: Enlarged.     Left Turbinates: Enlarged.     Mouth/Throat:     Mouth: Mucous membranes are moist.     Pharynx: Oropharynx is clear.  Eyes:     Conjunctiva/sclera: Conjunctivae normal.  Cardiovascular:  Rate and Rhythm: Normal rate and regular rhythm.     Pulses:          Dorsalis pedis pulses are 2+ on the right side and 2+ on the left side.     Heart sounds: No murmur heard. Pulmonary:     Effort: Pulmonary effort is normal. Prolonged expiration present. No respiratory distress.     Breath sounds: Normal breath sounds.  Abdominal:     Palpations: Abdomen is soft. There is no hepatomegaly or mass.     Tenderness: There is no abdominal tenderness.  Musculoskeletal:     Right lower leg: 1+ Pitting Edema present.     Left lower leg: 1+ Pitting Edema present.  Lymphadenopathy:     Cervical: No cervical adenopathy.  Skin:    General: Skin is warm.     Findings: No erythema or rash.  Neurological:     General: No focal deficit present.     Mental Status: She is alert and oriented to person, place, and time.     Cranial Nerves: No cranial nerve deficit.     Gait: Gait normal.  Psychiatric:        Mood and Affect: Mood and affect normal.   ASSESSMENT AND PLAN:  [name] was seen today for medical management of chronic issues.  Diagnoses and all orders for this visit: Asthma in adult, mild intermittent, uncomplicated Assessment & Plan: ?Associated COPD. Problem has improved but still having mild episodes of wheezing and cough. Recommend trying Combivent 20-100 mcg 1 puff 4 times daily as needed. Singulair 10 mg at bedtime added today. Continue Advair 250-50 mcg twice daily and rinsing mouth after use. For  now she would like to hold on immunology referral. Follow-up in 2 months.  Orders: -     Combivent Respimat; Inhale 1 puff into the lungs every 6 (six) hours as needed for wheezing.  Dispense: 4 g; Refill: 2 -     Montelukast Sodium; Take 1 tablet (10 mg total) by mouth at bedtime.  Dispense: 30 tablet; Refill: 3 -     Olmesartan Medoxomil-HCTZ; Take 1 tablet by mouth daily.  Dispense: 90 tablet; Refill: 1  Hypertension, essential, benign Assessment & Plan: Problem is adequately controlled. BP is adequately controlled. Continue Metoprolol Succinate 25 mg daily and olmesartan-hctz 20-12.5 mg daily as well as low salt diet. Continue monitoring BP regularly.  Orders: -     Olmesartan Medoxomil-HCTZ; Take 1 tablet by mouth daily.  Dispense: 90 tablet; Refill: 1  OSA (obstructive sleep apnea) Assessment & Plan: We discussed possible complications of problem when not treated appropriately. Encouraged to continue trying wearing CPAP, eventually she will be able to tolerate it well. Weight loss will help. Continue following with Dr. Radford Pax.   Non-seasonal allergic rhinitis, unspecified trigger Assessment & Plan: Continue using Flonase nasal spray daily as needed. Nasal saline irrigations as needed throughout the day. Continue Claritin 10 mg daily. Singulair 10 mg at night added today.  Orders: -     Montelukast Sodium; Take 1 tablet (10 mg total) by mouth at bedtime.  Dispense: 30 tablet; Refill: 3  Peripheral vascular disease, unspecified ([place]) Assessment & Plan: Peripheral pulses palpable today. She is not on statin medication or antiplatelet therapy. Last lower extremity artery Doppler in 2014 indicated mild PAD. Last LDL 109 in 08/2021. Rosuvastatin was recommended in the past.  Orders: -     Rosuvastatin Calcium; Take 1 tablet (10 mg total) by mouth daily. For cholesterol.  Dispense: 90  tablet; Refill: 2  Return in about 2 months (around 11/21/2022). She was last evaluated  by vascular in 04/2013 Sonya Caul G. [place], MD  [facility] [place]. [place] office.

## 2022-09-21 ENCOUNTER — Ambulatory Visit (INDEPENDENT_AMBULATORY_CARE_PROVIDER_SITE_OTHER): Payer: BC Managed Care – PPO | Admitting: Family Medicine

## 2022-09-21 VITALS — BP 128/80 | HR 96 | Temp 98.3°F | Resp 16 | Ht 63.0 in | Wt 301.0 lb

## 2022-09-21 DIAGNOSIS — G4733 Obstructive sleep apnea (adult) (pediatric): Secondary | ICD-10-CM | POA: Diagnosis not present

## 2022-09-21 DIAGNOSIS — I10 Essential (primary) hypertension: Secondary | ICD-10-CM | POA: Diagnosis not present

## 2022-09-21 DIAGNOSIS — I739 Peripheral vascular disease, unspecified: Secondary | ICD-10-CM

## 2022-09-21 DIAGNOSIS — J4520 Mild intermittent asthma, uncomplicated: Secondary | ICD-10-CM

## 2022-09-21 DIAGNOSIS — J3089 Other allergic rhinitis: Secondary | ICD-10-CM | POA: Diagnosis not present

## 2022-09-21 MED ORDER — MONTELUKAST SODIUM 10 MG PO TABS: 10.0000 mg | ORAL_TABLET | Freq: Every day | ORAL | 3 refills | Status: DC

## 2022-09-21 MED ORDER — COMBIVENT RESPIMAT 20-100 MCG/ACT IN AERS: 1.0000 | INHALATION_SPRAY | Freq: Four times a day (QID) | RESPIRATORY_TRACT | 2 refills | Status: DC | PRN

## 2022-09-21 MED ORDER — OLMESARTAN MEDOXOMIL-HCTZ 20-12.5 MG PO TABS: 1.0000 | ORAL_TABLET | Freq: Every day | ORAL | 1 refills | Status: DC

## 2022-09-21 MED ORDER — ROSUVASTATIN CALCIUM 10 MG PO TABS: 10.0000 mg | ORAL_TABLET | Freq: Every day | ORAL | 2 refills | Status: DC

## 2022-09-21 NOTE — Assessment & Plan Note (Addendum)
Peripheral pulses palpable today. She is not on statin medication or antiplatelet therapy. Last lower extremity artery Doppler in 2014 indicated mild PAD. Last LDL 109 in 08/2021. Rosuvastatin was recommended in the past.

## 2022-09-21 NOTE — Patient Instructions (Addendum)
A few things to remember from today's visit:  Hypertension, essential, benign  OSA (obstructive sleep apnea)  Asthma in adult, mild intermittent, uncomplicated  Continue Advair. Today Combivent added to use 4 times per day as needed. Stop Albuterol. Try to wear CPAP. Walking daily for 10-15 min will help. Singulair 10 mg at bedtime. Allergy specialist is an options if symptoms are still not well controlled.  If you need refills for medications you take chronically, please call your pharmacy. Do not use My Chart to request refills or for acute issues that need immediate attention. If you send a my chart message, it may take a few days to be addressed, specially if I am not in the office.  Please be sure medication list is accurate. If a new problem present, please set up appointment sooner than planned today.

## 2022-09-21 NOTE — Assessment & Plan Note (Signed)
Problem is adequately controlled. BP is adequately controlled. Continue Metoprolol Succinate 25 mg daily and olmesartan-hctz 20-12.5 mg daily as well as low salt diet. Continue monitoring BP regularly.

## 2022-09-21 NOTE — Assessment & Plan Note (Addendum)
?  Associated COPD. Problem has improved but still having mild episodes of wheezing and cough. Recommend trying Combivent 20-100 mcg 1 puff 4 times daily as needed. Singulair 10 mg at bedtime added today. Continue Advair 250-50 mcg twice daily and rinsing mouth after use. For now she would like to hold on immunology referral. Follow-up in 2 months.

## 2022-09-21 NOTE — Assessment & Plan Note (Signed)
Continue using Flonase nasal spray daily as needed. Nasal saline irrigations as needed throughout the day. Continue Claritin 10 mg daily. Singulair 10 mg at night added today.

## 2022-09-21 NOTE — Assessment & Plan Note (Signed)
We discussed possible complications of problem when not treated appropriately. Encouraged to continue trying wearing CPAP, eventually she will be able to tolerate it well. Weight loss will help. Continue following with Dr. Radford Pax.

## 2022-10-15 ENCOUNTER — Other Ambulatory Visit: Payer: Self-pay | Admitting: Family Medicine

## 2022-10-15 DIAGNOSIS — J4520 Mild intermittent asthma, uncomplicated: Secondary | ICD-10-CM

## 2022-10-15 DIAGNOSIS — J3089 Other allergic rhinitis: Secondary | ICD-10-CM

## 2022-10-18 ENCOUNTER — Other Ambulatory Visit: Payer: Self-pay | Admitting: Family Medicine

## 2022-10-18 DIAGNOSIS — H1013 Acute atopic conjunctivitis, bilateral: Secondary | ICD-10-CM

## 2022-11-18 NOTE — Progress Notes (Unsigned)
HPI: [name] is a 72 y.o. female, who is here today for chronic disease management.  Last seen on 09/21/22 for asthma follow up, persistent wheezing.  She reports improved symptoms with the use of Singular 10 mg daily, noting a reduction in wheezing. However, she mentions experiencing sneezing when going outside, though she denies any other respiratory symptoms like fever, chest pain, or difficulty breathing. She could not afford Combivent. She is on Advair 250-50 mcg bid and Albuterol inh.  Hypertension: She has not taking olmesartan-HCTZ 20-12.5 mg in about 7 days, reporting BP readings similar readings to today. She is also on metoprolol succinate 25 mg, prescribed by Dr. Mayford Knife to treat PACs. Negative for unusual or severe headache, visual changes, exertional chest pain, focal weakness, or worsening edema.  Lab Results  Component Value Date   CREATININE 0.88 08/23/2022   BUN 12 08/23/2022   NA 141 08/23/2022   K 3.6 08/23/2022   CL 104 08/23/2022   CO2 21 08/23/2022   She expresses concern about her legs, particularly after a recent incident where she fell out of bed. She describes ongoing issues with her knee and tenderness in her calf,achy like pain with touch and noted to be worse after leg massage. She denies experiencing calf pain while walking. PAD, she is not longer following with vascular. She follows with orthopedist, who has recommended total knee replacement but she is concerned about possible complications of surgery.  LE Doppler 2014, mild PAD. She is on Rosuvastatin 10 mg daily.  Lab Results  Component Value Date   CHOL 200 08/31/2021   HDL 77.80 08/31/2021   LDLCALC 109 (H) 08/31/2021   TRIG 67.0 08/31/2021   CHOLHDL 3 08/31/2021   Vit D def: She is not on vit D supplementation. Last 25 OH vit was in 04/2019 , 28.9.  She briefly touches on her dietary changes, noting an effort to incorporate more fruits and vegetables into her diet.  She has  not exercise regularly due to knee pain mainly.  Review of Systems  Constitutional:  Negative for appetite change, chills and fever.  HENT:  Negative for mouth sores and sore throat.   Gastrointestinal:  Negative for abdominal pain, nausea and vomiting.  Endocrine: Negative for cold intolerance and heat intolerance.  Genitourinary:  Negative for decreased urine volume, dysuria and hematuria.  Musculoskeletal:  Positive for arthralgias and gait problem.  Skin:  Negative for rash.  Allergic/Immunologic: Positive for environmental allergies.  Neurological:  Negative for syncope and facial asymmetry.  See other pertinent positives and negatives in HPI.  Current Outpatient Medications on File Prior to Visit  Medication Sig Dispense Refill   Cholecalciferol 100 MCG (4000 UT) CAPS Take 2,000 Units by mouth daily.     CVS OLOPATADINE HCL 0.2 % SOLN APPLY 1 DROP TO EYE DAILY AS NEEDED. 2.5 mL 0   fluticasone (FLONASE) 50 MCG/ACT nasal spray Place 1 spray into both nostrils at bedtime. 18.2 mL 2   fluticasone-salmeterol (ADVAIR DISKUS) 250-50 MCG/ACT AEPB Inhale 1 puff into the lungs in the morning and at bedtime. 60 each 6   loratadine (CLARITIN) 10 MG tablet Take 10 mg by mouth daily.     montelukast (SINGULAIR) 10 MG tablet TAKE 1 TABLET BY MOUTH EVERYDAY AT BEDTIME 90 tablet 1   rosuvastatin (CRESTOR) 10 MG tablet Take 1 tablet (10 mg total) by mouth daily. For cholesterol. 90 tablet 2   sodium chloride (OCEAN) 0.65 % SOLN nasal spray Place 2 sprays  into both nostrils in the morning and at bedtime.  0   No current facility-administered medications on file prior to visit.   Past Medical History:  Diagnosis Date   Allergy    seasonal   Anemia    past hx    Arthritis    Asthma    Chicken pox    Chronic kidney disease    Family history of colon cancer in father    age 70 and sister age 68    GERD (gastroesophageal reflux disease)    History of frequent urinary tract infections     Hypertension    Obesity    Osteopenia    Peptic ulcer    history of PUD   Allergies  Allergen Reactions   Codeine Palpitations   Erythromycin Hives and Nausea And Vomiting   Other     Bees, mold, grass, cats    Social History   Socioeconomic History   Marital status: Widowed    Spouse name: Not on file   Number of children: Not on file   Years of education: Not on file   Highest education level: Bachelor's degree (e.g., BA, AB, BS)  Occupational History   Not on file  Tobacco Use   Smoking status: Never   Smokeless tobacco: Never  Substance and Sexual Activity   Alcohol use: No    Alcohol/week: 1.0 standard drink of alcohol    Types: 1 Glasses of wine per week    Comment: 1 glass wine Q 3-4 months    Drug use: No   Sexual activity: Not on file  Other Topics Concern   Not on file  Social History Narrative   Not on file   Social Determinants of Health   Financial Resource Strain: Low Risk  (09/21/2022)   Overall Financial Resource Strain (CARDIA)    Difficulty of Paying Living Expenses: Not very hard  Food Insecurity: No Food Insecurity (09/21/2022)   Hunger Vital Sign    Worried About Running Out of Food in the Last Year: Never true    Ran Out of Food in the Last Year: Never true  Transportation Needs: No Transportation Needs (09/21/2022)   PRAPARE - Administrator, Civil Service (Medical): No    Lack of Transportation (Non-Medical): No  Physical Activity: Unknown (09/21/2022)   Exercise Vital Sign    Days of Exercise per Week: 0 days    Minutes of Exercise per Session: Not on file  Stress: No Stress Concern Present (09/21/2022)   [employer] of Occupational Health - Occupational Stress Questionnaire    Feeling of Stress : Not at all  Social Connections: Moderately Integrated (09/21/2022)   Social Connection and Isolation Panel [NHANES]    Frequency of Communication with Friends and Family: More than three times a week    Frequency of Social Gatherings  with Friends and Family: Twice a week    Attends Religious Services: More than 4 times per year    Active Member of [employer] or Organizations: Yes    Attends Banker Meetings: More than 4 times per year    Marital Status: Widowed   Vitals:   11/21/22 0730  BP: 128/84  Pulse: 96  Resp: 16  Temp: 97.8 F (36.6 C)  SpO2: 96%   Wt Readings from Last 3 Encounters:  11/21/22 (!) 302 lb (137 kg)  09/21/22 (!) 301 lb (136.5 kg)  08/23/22 289 lb (131.1 kg)  Body mass index is 53.5 kg/m. Physical  Exam Vitals and nursing note reviewed.  Constitutional:      General: She is not in acute distress.    Appearance: She is well-developed.  HENT:     Head: Normocephalic and atraumatic.     Mouth/Throat:     Mouth: Mucous membranes are moist.     Pharynx: Oropharynx is clear.  Eyes:     Conjunctiva/sclera: Conjunctivae normal.  Cardiovascular:     Rate and Rhythm: Normal rate and regular rhythm. Occasional Extrasystoles are present.    Pulses:          Dorsalis pedis pulses are 2+ on the right side and 2+ on the left side.     Heart sounds: No murmur heard.    Comments: Varicose veins LE,bilateral. Pulmonary:     Effort: Pulmonary effort is normal. No respiratory distress.     Breath sounds: Normal breath sounds.  Abdominal:     Palpations: Abdomen is soft. There is no hepatomegaly or mass.     Tenderness: There is no abdominal tenderness.  Musculoskeletal:     Right lower leg: 1+ Pitting Edema present.     Left lower leg: 1+ Pitting Edema present.  Lymphadenopathy:     Cervical: No cervical adenopathy.  Skin:    General: Skin is warm.     Findings: No erythema or rash.  Neurological:     General: No focal deficit present.     Mental Status: She is alert and oriented to person, place, and time.     Cranial Nerves: No cranial nerve deficit.     Comments: Antalgic gait, not assisted.  Psychiatric:        Mood and Affect: Mood and affect normal.   ASSESSMENT AND  PLAN:  [name] was seen today for medical management of chronic issues.  Diagnoses and all orders for this visit:  Hypertension, essential, benign Assessment & Plan: BP otherwise adequately controlled. She will resume olmesartan-HCTZ 20-12.5 mg daily. Continue metoprolol succinate 25 mg daily, which was added to treat PACs. Continue monitoring BP regularly. Low-salt diet also recommended.  Orders: -     Olmesartan Medoxomil-HCTZ; Take 1 tablet by mouth daily.  Dispense: 90 tablet; Refill: 2 -     Metoprolol Succinate ER; Take 1 tablet (25 mg total) by mouth daily.  Dispense: 90 tablet; Refill: 2  Asthma in adult, mild intermittent, uncomplicated Assessment & Plan: Problem has improved with adding Singulair 10 mg daily, she is no longer having wheezing or cough. Continue Singulair 10 mg at bedtime, Advair 250-50 mcg twice daily and albuterol inhaler 1 to 2 puff 4 times daily as needed. Weight loss will help.  Orders: -     Olmesartan Medoxomil-HCTZ; Take 1 tablet by mouth daily.  Dispense: 90 tablet; Refill: 2  Varicose veins of both legs with edema Assessment & Plan: Recommend wearing compression stockings. Continue adequate skin care. For now she is not interested in varicose veins treatment.   Vitamin D deficiency, unspecified Assessment & Plan: Could be contributing factor to LE myalgias. Currently she is not on vitamin D supplementation. Further recommendation will be given according to 25 OH vitamin D results.  Orders: -     VITAMIN D 25 Hydroxy (Vit-D Deficiency, Fractures); Future  Peripheral vascular disease, unspecified ([facility]) Assessment & Plan: Mild. She is not having claudication-like symptoms. LE artery Doppler will be arranged. Continue rosuvastatin 10 mg daily.  She is not fasting today, we will plan on lipid panel next visit.  Orders: -  LOWER EXTREMITY ARTERIAL DUPLEX BILAT (BACK OFFICE); Future  Morbid obesity with BMI of 50.0-59.9, adult  [facility]) Assessment & Plan: Wt otherwise stable. She understands the benefits of wt loss as well as adverse effects of obesity. Consistency with healthy diet and low impact physical activity encouraged. Continue high fiber/vegetables intake and decrease fruit service to 1-2 per day.   Localized osteoarthritis of knees, bilateral Assessment & Plan: Problem is getting worse, causing limitations in mobility. For now she denied stating surgical treatments. Continue following with orthopedics.   I spent a total of 43 minutes in both face to face and non face to face activities for this visit on the date of this encounter. During this time history was obtained and documented, examination was performed, prior labs/imaging reviewed, and assessment/plan discussed.  Return in about 5 months (around 04/23/2023) for chronic problems.  Chelse Matas G. [place], MD  [facility]. [place] office.

## 2022-11-21 ENCOUNTER — Encounter: Payer: Self-pay | Admitting: Family Medicine

## 2022-11-21 ENCOUNTER — Other Ambulatory Visit: Payer: Self-pay

## 2022-11-21 ENCOUNTER — Ambulatory Visit (INDEPENDENT_AMBULATORY_CARE_PROVIDER_SITE_OTHER): Payer: BC Managed Care – PPO | Admitting: Family Medicine

## 2022-11-21 VITALS — BP 128/84 | HR 96 | Temp 97.8°F | Resp 16 | Ht 63.0 in | Wt 302.0 lb

## 2022-11-21 DIAGNOSIS — M170 Bilateral primary osteoarthritis of knee: Secondary | ICD-10-CM

## 2022-11-21 DIAGNOSIS — J4520 Mild intermittent asthma, uncomplicated: Secondary | ICD-10-CM

## 2022-11-21 DIAGNOSIS — Z6841 Body Mass Index (BMI) 40.0 and over, adult: Secondary | ICD-10-CM

## 2022-11-21 DIAGNOSIS — I10 Essential (primary) hypertension: Secondary | ICD-10-CM

## 2022-11-21 DIAGNOSIS — E559 Vitamin D deficiency, unspecified: Secondary | ICD-10-CM | POA: Diagnosis not present

## 2022-11-21 DIAGNOSIS — I739 Peripheral vascular disease, unspecified: Secondary | ICD-10-CM

## 2022-11-21 DIAGNOSIS — I83893 Varicose veins of bilateral lower extremities with other complications: Secondary | ICD-10-CM

## 2022-11-21 LAB — VITAMIN D 25 HYDROXY (VIT D DEFICIENCY, FRACTURES): VITD: 21.28 ng/mL — ABNORMAL LOW (ref 30.00–100.00)

## 2022-11-21 MED ORDER — OLMESARTAN MEDOXOMIL-HCTZ 20-12.5 MG PO TABS: 1.0000 | ORAL_TABLET | Freq: Every day | ORAL | 2 refills | Status: DC

## 2022-11-21 MED ORDER — METOPROLOL SUCCINATE ER 25 MG PO TB24: 25.0000 mg | ORAL_TABLET | Freq: Every day | ORAL | 2 refills | Status: DC

## 2022-11-21 NOTE — Assessment & Plan Note (Signed)
Could be contributing factor to LE myalgias. Currently she is not on vitamin D supplementation. Further recommendation will be given according to 25 OH vitamin D results.

## 2022-11-21 NOTE — Assessment & Plan Note (Signed)
Problem has improved with adding Singulair 10 mg daily, she is no longer having wheezing or cough. Continue Singulair 10 mg at bedtime, Advair 250-50 mcg twice daily and albuterol inhaler 1 to 2 puff 4 times daily as needed. Weight loss will help.

## 2022-11-21 NOTE — Assessment & Plan Note (Signed)
BP otherwise adequately controlled. She will resume olmesartan-HCTZ 20-12.5 mg daily. Continue metoprolol succinate 25 mg daily, which was added to treat PACs. Continue monitoring BP regularly. Low-salt diet also recommended.

## 2022-11-21 NOTE — Patient Instructions (Addendum)
A few things to remember from today's visit:  Hypertension, essential, benign - Plan: olmesartan-hydrochlorothiazide (BENICAR HCT) 20-12.5 MG tablet  Asthma in adult, mild intermittent, uncomplicated - Plan: olmesartan-hydrochlorothiazide (BENICAR HCT) 20-12.5 MG tablet  Varicose veins of both legs with edema  Vitamin D deficiency, unspecified - Plan: VITAMIN D 25 Hydroxy (Vit-D Deficiency, Fractures)  Peripheral vascular disease, unspecified (HCC) - Plan: [facility] LOWER EXTREMITY ARTERIAL DUPLEX BILAT (BACK OFFICE)  Resume Olmesartan-HCTZ. Fasting labs next visit. Limit fruit intake to 1-2 small services per day.  If you need refills for medications you take chronically, please call your pharmacy. Do not use My Chart to request refills or for acute issues that need immediate attention. If you send a my chart message, it may take a few days to be addressed, specially if I am not in the office.  Please be sure medication list is accurate. If a new problem present, please set up appointment sooner than planned today.

## 2022-11-21 NOTE — Assessment & Plan Note (Signed)
Mild. She is not having claudication-like symptoms. LE artery Doppler will be arranged. Continue rosuvastatin 10 mg daily.  She is not fasting today, we will plan on lipid panel next visit.

## 2022-11-21 NOTE — Assessment & Plan Note (Signed)
Wt otherwise stable. She understands the benefits of wt loss as well as adverse effects of obesity. Consistency with healthy diet and low impact physical activity encouraged. Continue high fiber/vegetables intake and decrease fruit service to 1-2 per day.

## 2022-11-21 NOTE — Assessment & Plan Note (Signed)
Recommend wearing compression stockings. Continue adequate skin care. For now she is not interested in varicose veins treatment.

## 2022-11-21 NOTE — Assessment & Plan Note (Signed)
Problem is getting worse, causing limitations in mobility. For now she denied stating surgical treatments. Continue following with orthopedics.

## 2023-02-13 ENCOUNTER — Ambulatory Visit: Payer: BC Managed Care – PPO | Attending: Cardiology | Admitting: Cardiology

## 2023-02-13 ENCOUNTER — Encounter: Payer: Self-pay | Admitting: Cardiology

## 2023-02-13 NOTE — Progress Notes (Deleted)
Sleep Medicine Note    Date:  02/13/2023   ID:  DODI WRAGGE, DOB 1950/08/06, MRN [id number]  PCP:  [place], Betty G, MD  Cardiologist:  Armanda Magic, MD   No chief complaint on file.   History of Present Illness:  Sonya Gilbert is a 72 y.o. female  with a history of CKD, GERD, hypertension and palpitations who was referred for a home sleep study by Dr. Verdis Prime due to complaints of snoring, frequent atrial arrhythmias and morbid obesity.  She underwent home sleep study in June 2022 showing moderate obstructive sleep apnea with an AHI of 19/h and no significant central events.  O2 saturations were as low as 84%.  She was started on auto CPAP from 4 to 15 cm H2O.  She is here today for followup and is doing well.  She denies any chest pain or pressure, SOB, DOE, PND, orthopnea, LE edema, dizziness, palpitations or syncope. She is compliant with her meds and is tolerating meds with no SE.    She is doing well with her PAP device and thinks that she has gotten used to it.  SHe tolerates the mask and feels the pressure is adequate.  Since going on PAP she feels rested in the am and has no significant daytime sleepiness.  She denies any significant mouth or nasal dryness or nasal congestion.  She does not think that he snores.    Past Medical History:  Diagnosis Date   Allergy    seasonal   Anemia    past hx    Arthritis    Asthma    Chicken pox    Chronic kidney disease    Family history of colon cancer in father    age 58 and sister age 48    GERD (gastroesophageal reflux disease)    History of frequent urinary tract infections    Hypertension    Obesity    Osteopenia    Peptic ulcer    history of PUD    Past Surgical History:  Procedure Laterality Date   ABDOMINAL HYSTERECTOMY  1996   BREAST BIOPSY Right    BREAST SURGERY  2014   biopsy   COLONOSCOPY  2004   [facility]- normal    TONSILLECTOMY     At 72 yrs old    Current Medications: No outpatient  medications have been marked as taking for the 02/13/23 encounter (Appointment) with Quintella Reichert, MD.    Allergies:   Codeine, Erythromycin, and Other   Social History   Socioeconomic History   Marital status: Widowed    Spouse name: Not on file   Number of children: Not on file   Years of education: Not on file   Highest education level: Bachelor's degree (e.g., BA, AB, BS)  Occupational History   Not on file  Tobacco Use   Smoking status: Never   Smokeless tobacco: Never  Substance and Sexual Activity   Alcohol use: No    Alcohol/week: 1.0 standard drink of alcohol    Types: 1 Glasses of wine per week    Comment: 1 glass wine Q 3-4 months    Drug use: No   Sexual activity: Not on file  Other Topics Concern   Not on file  Social History Narrative   Not on file   Social Determinants of Health   Financial Resource Strain: Low Risk  (09/21/2022)   Overall Financial Resource Strain (CARDIA)    Difficulty of Paying Living  Expenses: Not very hard  Food Insecurity: No Food Insecurity (09/21/2022)   Hunger Vital Sign    Worried About Running Out of Food in the Last Year: Never true    Ran Out of Food in the Last Year: Never true  Transportation Needs: No Transportation Needs (09/21/2022)   PRAPARE - Administrator, Civil Service (Medical): No    Lack of Transportation (Non-Medical): No  Physical Activity: Unknown (09/21/2022)   Exercise Vital Sign    Days of Exercise per Week: 0 days    Minutes of Exercise per Session: Not on file  Stress: No Stress Concern Present (09/21/2022)   [employer] of Occupational Health - Occupational Stress Questionnaire    Feeling of Stress : Not at all  Social Connections: Moderately Integrated (09/21/2022)   Social Connection and Isolation Panel [NHANES]    Frequency of Communication with Friends and Family: More than three times a week    Frequency of Social Gatherings with Friends and Family: Twice a week    Attends Religious  Services: More than 4 times per year    Active Member of [employer] or Organizations: Yes    Attends Banker Meetings: More than 4 times per year    Marital Status: Widowed     Family History:  The patient's parent states pain Aurther Loft with family history includes Breast cancer in her sister; Cancer in her father, sister, and sister; Colon cancer (age of onset: 36) in her sister; Colon cancer (age of onset: 3) in her father; Colon polyps in her brother; Deep vein thrombosis in her mother and sister; Heart disease in her mother; Hypertension in her brother, mother, and sister.   ROS:   Please see the history of present illness.    ROS All other systems reviewed and are negative.     06/03/2019   11:05 AM  PAD Screen  Previous PAD dx? No  Previous surgical procedure? No  Pain with walking? No  Feet/toe relief with dangling? No  Painful, non-healing ulcers? No  Extremities discolored? Yes     PHYSICAL EXAM:   VS:  There were no vitals taken for this visit.   GEN: Well nourished, well developed in no acute distress HEENT: Normal NECK: No JVD; No carotid bruits LYMPHATICS: No lymphadenopathy CARDIAC:RRR, no murmurs, rubs, gallops RESPIRATORY:  Clear to auscultation without rales, wheezing or rhonchi  ABDOMEN: Soft, non-tender, non-distended MUSCULOSKELETAL:  No edema; No deformity  SKIN: Warm and dry NEUROLOGIC:  Alert and oriented x 3 PSYCHIATRIC:  Normal affect  Wt Readings from Last 3 Encounters:  11/21/22 (!) 302 lb (137 kg)  09/21/22 (!) 301 lb (136.5 kg)  08/23/22 289 lb (131.1 kg)     Studies/Labs Reviewed:   PAP compliance download  Recent Labs: 08/23/2022: BUN 12; Creatinine, Ser 0.88; Hemoglobin 14.4; Platelets 231.0; Potassium 3.6; Sodium 141   Lipid Panel    Component Value Date/Time   CHOL 200 08/31/2021 0808   TRIG 67.0 08/31/2021 0808   HDL 77.80 08/31/2021 0808   CHOLHDL 3 08/31/2021 0808   VLDL 13.4 08/31/2021 0808   LDLCALC 109 (H)  08/31/2021 0808     Additional studies/ records that were reviewed today include:  none    ASSESSMENT:    1. OSA (obstructive sleep apnea)   2. Hypertension, essential, benign       PLAN:  In order of problems listed above:  OSA - The patient is tolerating PAP therapy well without any problems. The  PAP download performed by his DME was personally reviewed and interpreted by me today and showed an AHI of ***/hr on *** cm H2O with ***% compliance in using more than 4 hours nightly.  The patient has been using and benefiting from PAP use and will continue to benefit from therapy.   HTN -BP controlled on exam today -Continue prescription drug management with Toprol-XL 25 mg a day and olmesartan HCT 20-12.5 mg daily with as needed refills -I have personally reviewed and interpreted outside labs performed by patient's PCP which showed serum creatinine 0.88 and potassium 3.6 on 08/23/2022  Time Spent: 20 minutes total time of encounter, including 15 minutes spent in face-to-face patient care on the date of this encounter. This time includes coordination of care and counseling regarding above mentioned problem list. Remainder of non-face-to-face time involved reviewing chart documents/testing relevant to the patient encounter and documentation in the medical record. I have independently reviewed documentation from referring provider  Medication Adjustments/Labs and Tests Ordered: Current medicines are reviewed at length with the patient today.  Concerns regarding medicines are outlined above.  Medication changes, Labs and Tests ordered today are listed in the Patient Instructions below.  There are no Patient Instructions on file for this visit.   Signed, Armanda Magic, MD  02/13/2023 10:09 AM    [facility] [address] [place], [place], [place]  [address] Phone: [phone]; Fax: [phone]

## 2023-04-21 NOTE — Progress Notes (Signed)
HPI: [name] is a 72 y.o. female with a PMHx significant for HTN, asthma, OSA, HLD, OA, and vitamin D deficiency, who is here today for chronic disease management.  Last seen on 11/21/2022  No new problems since her last visit.  Hypertension: She is currently taking metoprolol-succinate 25 mg daily and omesartan-hydrochlorothiazide 20-12.5 mg daily.  BP readings at home: She says she checks occasionally at home. She says her last reading was 124/78. Side effects: none Her last eye exam was around 2 years ago.  Negative for unusual or severe headache, visual changes, exertional chest pain, dyspnea,  focal weakness, or edema.  She endorses occasional palpitations, occasionally associated with "little" SOB, no associated dizziness or diaphoresis. She follows with cardiology regularly.  Lab Results  Component Value Date   CREATININE 0.88 08/23/2022   BUN 12 08/23/2022   NA 141 08/23/2022   K 3.6 08/23/2022   CL 104 08/23/2022   CO2 21 08/23/2022   Hyperlipidemia: Currently on rosuvastatin 10 mg daily.  Lab Results  Component Value Date   CHOL 200 08/31/2021   HDL 77.80 08/31/2021   LDLCALC 109 (H) 08/31/2021   TRIG 67.0 08/31/2021   CHOLHDL 3 08/31/2021   PAD: Last visit LE Doppler US was ordered, states that she did not receive appointment information. She denies claudication-like symptoms. Last ABI in 2014 showed mild PAD.  OSA: She has not been wearing her CPAP. Her last sleep study was about a year ago.   Asthma: She has been taking Advair Diskus 250-50 mcg/act. She reports she needs  to use albuterol inhaler a couple times per month. She has been taking Claritin 10 mg daily. She is no longer on Singulair.   Vitamin D deficiency: She has rarely been taking her vitamin D supplements.  25 OH vitamin D was 21.2 in 11/2022.  Review of Systems  Constitutional:  Negative for activity change, appetite change and fever.  HENT:  Negative for mouth sores,  nosebleeds and trouble swallowing.   Gastrointestinal:  Negative for abdominal pain, nausea and vomiting.       Negative for changes in bowel habits.  Endocrine: Negative for cold intolerance and heat intolerance.  Genitourinary:  Negative for decreased urine volume and hematuria.  Skin:  Negative for rash.  Neurological:  Negative for syncope and facial asymmetry.  See other pertinent positives and negatives in HPI.  Current Outpatient Medications on File Prior to Visit  Medication Sig Dispense Refill   Cholecalciferol 100 MCG (4000 UT) CAPS Take 2,000 Units by mouth daily.     [facility] OLOPATADINE HCL 0.2 % SOLN APPLY 1 DROP TO EYE DAILY AS NEEDED. 2.5 mL 0   fluticasone (FLONASE) 50 MCG/ACT nasal spray Place 1 spray into both nostrils at bedtime. 18.2 mL 2   fluticasone-salmeterol (ADVAIR DISKUS) 250-50 MCG/ACT AEPB Inhale 1 puff into the lungs in the morning and at bedtime. 60 each 6   loratadine (CLARITIN) 10 MG tablet Take 10 mg by mouth daily.     metoprolol succinate (TOPROL-XL) 25 MG 24 hr tablet Take 1 tablet (25 mg total) by mouth daily. 90 tablet 2   montelukast (SINGULAIR) 10 MG tablet TAKE 1 TABLET BY MOUTH EVERYDAY AT BEDTIME 90 tablet 1   olmesartan-hydrochlorothiazide (BENICAR HCT) 20-12.5 MG tablet Take 1 tablet by mouth daily. 90 tablet 2   rosuvastatin (CRESTOR) 10 MG tablet Take 1 tablet (10 mg total) by mouth daily. For cholesterol. 90 tablet 2   sodium chloride (OCEAN) 0.65 %  SOLN nasal spray Place 2 sprays into both nostrils in the morning and at bedtime.  0   No current facility-administered medications on file prior to visit.    Past Medical History:  Diagnosis Date   Allergy    seasonal   Anemia    past hx    Arthritis    Asthma    Chicken pox    Chronic kidney disease    Family history of colon cancer in father    age 26 and sister age 31    GERD (gastroesophageal reflux disease)    History of frequent urinary tract infections    Hypertension    Obesity     Osteopenia    Peptic ulcer    history of PUD   Allergies  Allergen Reactions   Codeine Palpitations   Erythromycin Hives and Nausea And Vomiting   Other     Bees, mold, grass, cats     Social History   Socioeconomic History   Marital status: Widowed    Spouse name: Not on file   Number of children: Not on file   Years of education: Not on file   Highest education level: Bachelor's degree (e.g., BA, AB, BS)  Occupational History   Not on file  Tobacco Use   Smoking status: Never   Smokeless tobacco: Never  Substance and Sexual Activity   Alcohol use: No    Alcohol/week: 1.0 standard drink of alcohol    Types: 1 Glasses of wine per week    Comment: 1 glass wine Q 3-4 months    Drug use: No   Sexual activity: Not on file  Other Topics Concern   Not on file  Social History Narrative   Not on file   Social Determinants of Health   Financial Resource Strain: Low Risk  (09/21/2022)   Overall Financial Resource Strain (CARDIA)    Difficulty of Paying Living Expenses: Not very hard  Food Insecurity: No Food Insecurity (09/21/2022)   Hunger Vital Sign    Worried About Running Out of Food in the Last Year: Never true    Ran Out of Food in the Last Year: Never true  Transportation Needs: No Transportation Needs (09/21/2022)   PRAPARE - Administrator, Civil Service (Medical): No    Lack of Transportation (Non-Medical): No  Physical Activity: Unknown (09/21/2022)   Exercise Vital Sign    Days of Exercise per Week: 0 days    Minutes of Exercise per Session: Not on file  Stress: No Stress Concern Present (09/21/2022)   [employer] of Occupational Health - Occupational Stress Questionnaire    Feeling of Stress : Not at all  Social Connections: Moderately Integrated (09/21/2022)   Social Connection and Isolation Panel [NHANES]    Frequency of Communication with Friends and Family: More than three times a week    Frequency of Social Gatherings with Friends and  Family: Twice a week    Attends Religious Services: More than 4 times per year    Active Member of [employer] or Organizations: Yes    Attends Banker Meetings: More than 4 times per year    Marital Status: Widowed   Today's Vitals   04/24/23 0731  BP: 130/80  Pulse: 94  Resp: 16  Temp: 98.1 F (36.7 C)  TempSrc: Oral  SpO2: 95%  Weight: 297 lb 8 oz (134.9 kg)  Height: 5\' 3"  (1.6 m)   Body mass index is 52.7 kg/m.  Physical  Exam Vitals and nursing note reviewed.  Constitutional:      General: She is not in acute distress.    Appearance: She is well-developed.  HENT:     Head: Normocephalic and atraumatic.     Mouth/Throat:     Mouth: Mucous membranes are moist.     Pharynx: Oropharynx is clear.  Eyes:     Conjunctiva/sclera: Conjunctivae normal.  Cardiovascular:     Rate and Rhythm: Normal rate. Rhythm irregular. Occasional Extrasystoles are present.    Pulses:          Dorsalis pedis pulses are 2+ on the right side and 2+ on the left side.     Heart sounds: No murmur heard.    Comments: Trace LE edema, bilateral. Pulmonary:     Effort: Pulmonary effort is normal. No respiratory distress.     Breath sounds: Normal breath sounds.  Abdominal:     Palpations: Abdomen is soft. There is no mass.     Tenderness: There is no abdominal tenderness.  Skin:    General: Skin is warm.     Findings: No erythema or rash.  Neurological:     General: No focal deficit present.     Mental Status: She is alert and oriented to person, place, and time.     Comments: Gait is not assisted.  Psychiatric:        Mood and Affect: Mood and affect normal.   ASSESSMENT AND PLAN:  Ms. Laffey was seen today for chronic disease follow up.   Lab Results  Component Value Date   CHOL 196 04/24/2023   HDL 82.60 04/24/2023   LDLCALC 101 (H) 04/24/2023   TRIG 62.0 04/24/2023   CHOLHDL 2 04/24/2023   Lab Results  Component Value Date   NA 142 04/24/2023   CL 105 04/24/2023   K  3.7 04/24/2023   CO2 24 04/24/2023   BUN 11 04/24/2023   CREATININE 0.84 04/24/2023   GFR 70.57 04/24/2023   CALCIUM 9.0 04/24/2023   ALBUMIN 3.8 04/24/2023   GLUCOSE 87 04/24/2023   Lab Results  Component Value Date   ALT 9 04/24/2023   AST 13 04/24/2023   ALKPHOS 88 04/24/2023   BILITOT 0.7 04/24/2023   Mixed hyperlipidemia Assessment & Plan: Continue rosuvastatin 10 mg daily and low-fat diet. Further recommendation will be given according to lipid panel result.  Orders: -     Lipid panel; Future  Hypertension, essential, benign Assessment & Plan: BP adequately controlled, has not taken antihypertensive medication consistently. BP readings when she takes meds daily 120's/70's. Continue Olmesartan-HCTZ 20-12.5 mg daily and metoprolol succinate 25 mg daily. Continue monitoring BP regularly. Low-salt diet also recommended. Eye exam overdue.  Orders: -     Comprehensive metabolic panel; Future  Vitamin D deficiency, unspecified Assessment & Plan: She has not been consistent with taking vitamin D supplementation, she would like to have it checked today. Further recommendation will be given according to 25 OH vitamin D result.  Orders: -     VITAMIN D 25 Hydroxy (Vit-D Deficiency, Fractures); Future  Peripheral vascular disease, unspecified (HCC) Assessment & Plan: Peripheral pulses palpable. States that she has not been contacted for lower extremity Doppler, so ABI order placed. Continue Rosuvastatin 10 mg daily.  Orders: -     VAS [place] ABI WITH/WO TBI; Future  OSA (obstructive sleep apnea) Assessment & Plan: Last sleep study about a year ago. We discussed possible complications if not appropriately treated. Encouraged to try wearing  her CPAP again.   Asymptomatic postmenopausal estrogen deficiency -     DG Bone Density; Future  Asthma in adult, mild intermittent, uncomplicated Assessment & Plan: Problem has improved. Continue Advair 250-50 mcg twice daily  and albuterol inhaler 1 to 2 puff 4 times daily as needed as well as Claritin 10 mg daily. She is no longer on Singulair.   I spent a total of 40 minutes in both face to face and non face to face activities for this visit on the date of this encounter. During this time history was obtained and documented, examination was performed, prior labs/imaging reviewed, and assessment/plan discussed.  Return in about 6 months (around 10/23/2023) for chronic problems.  I, Suanne Marker, acting as a scribe for Sonya Pucci [place], MD., have documented all relevant documentation on the behalf of Kaelon Weekes [place], MD, as directed by  Lia Vigilante [place], MD while in the presence of Mercer Peifer [place], MD.   I, Suanne Marker, have reviewed all documentation for this visit. The documentation on 04/21/23 for the exam, diagnosis, procedures, and orders are all accurate and complete.  Dillyn Menna G. [place], MD  [facility]. [place] office.

## 2023-04-24 ENCOUNTER — Encounter: Payer: Self-pay | Admitting: Family Medicine

## 2023-04-24 ENCOUNTER — Ambulatory Visit (INDEPENDENT_AMBULATORY_CARE_PROVIDER_SITE_OTHER): Payer: BC Managed Care – PPO | Admitting: Family Medicine

## 2023-04-24 VITALS — BP 130/80 | HR 94 | Temp 98.1°F | Resp 16 | Ht 63.0 in | Wt 297.5 lb

## 2023-04-24 DIAGNOSIS — G4733 Obstructive sleep apnea (adult) (pediatric): Secondary | ICD-10-CM

## 2023-04-24 DIAGNOSIS — E559 Vitamin D deficiency, unspecified: Secondary | ICD-10-CM | POA: Diagnosis not present

## 2023-04-24 DIAGNOSIS — I739 Peripheral vascular disease, unspecified: Secondary | ICD-10-CM | POA: Diagnosis not present

## 2023-04-24 DIAGNOSIS — E782 Mixed hyperlipidemia: Secondary | ICD-10-CM

## 2023-04-24 DIAGNOSIS — I10 Essential (primary) hypertension: Secondary | ICD-10-CM

## 2023-04-24 DIAGNOSIS — Z780 Asymptomatic menopausal state: Secondary | ICD-10-CM

## 2023-04-24 DIAGNOSIS — J4520 Mild intermittent asthma, uncomplicated: Secondary | ICD-10-CM

## 2023-04-24 LAB — COMPREHENSIVE METABOLIC PANEL
ALT: 9 U/L (ref 0–35)
AST: 13 U/L (ref 0–37)
Albumin: 3.8 g/dL (ref 3.5–5.2)
Alkaline Phosphatase: 88 U/L (ref 39–117)
BUN: 11 mg/dL (ref 6–23)
CO2: 24 meq/L (ref 19–32)
Calcium: 9 mg/dL (ref 8.4–10.5)
Chloride: 105 meq/L (ref 96–112)
Creatinine, Ser: 0.84 mg/dL (ref 0.40–1.20)
GFR: 70.57 mL/min (ref >60.00)
Glucose, Bld: 87 mg/dL (ref 70–99)
Potassium: 3.7 meq/L (ref 3.5–5.1)
Sodium: 142 meq/L (ref 135–145)
Total Bilirubin: 0.7 mg/dL (ref 0.2–1.2)
Total Protein: 7 g/dL (ref 6.0–8.3)

## 2023-04-24 LAB — LIPID PANEL
Cholesterol: 196 mg/dL (ref 0–200)
HDL: 82.6 mg/dL (ref >39.00)
LDL Cholesterol: 101 mg/dL — ABNORMAL HIGH (ref 0–99)
NonHDL: 113.67
Total CHOL/HDL Ratio: 2
Triglycerides: 62 mg/dL (ref 0.0–149.0)
VLDL: 12.4 mg/dL (ref 0.0–40.0)

## 2023-04-24 LAB — VITAMIN D 25 HYDROXY (VIT D DEFICIENCY, FRACTURES): VITD: 20.82 ng/mL — ABNORMAL LOW (ref 30.00–100.00)

## 2023-04-24 NOTE — Assessment & Plan Note (Signed)
Last sleep study about a year ago. We discussed possible complications if not appropriately treated. Encouraged to try wearing her CPAP again.

## 2023-04-24 NOTE — Assessment & Plan Note (Signed)
Continue rosuvastatin 10 mg daily and low-fat diet. Further recommendation will be given according to lipid panel result. 

## 2023-04-24 NOTE — Assessment & Plan Note (Signed)
Problem has improved. Continue Advair 250-50 mcg twice daily and albuterol inhaler 1 to 2 puff 4 times daily as needed as well as Claritin 10 mg daily. She is no longer on Singulair.

## 2023-04-24 NOTE — Assessment & Plan Note (Signed)
She has not been consistent with taking vitamin D supplementation, she would like to have it checked today. Further recommendation will be given according to 25 OH vitamin D result.

## 2023-04-24 NOTE — Assessment & Plan Note (Signed)
BP adequately controlled, has not taken antihypertensive medication consistently. BP readings when she takes meds daily 120's/70's. Continue Olmesartan-HCTZ 20-12.5 mg daily and metoprolol succinate 25 mg daily. Continue monitoring BP regularly. Low-salt diet also recommended. Eye exam overdue.

## 2023-04-24 NOTE — Assessment & Plan Note (Addendum)
Peripheral pulses palpable. States that she has not been contacted for lower extremity Doppler, so ABI order placed. Continue Rosuvastatin 10 mg daily.

## 2023-04-24 NOTE — Patient Instructions (Signed)
A few things to remember from today's visit:  Mixed hyperlipidemia - Plan: Lipid panel  Hypertension, essential, benign - Plan: Comprehensive metabolic panel  Vitamin D deficiency, unspecified - Plan: VITAMIN D 25 Hydroxy (Vit-D Deficiency, Fractures)  Peripheral vascular disease, unspecified (HCC) - Plan: VAS [place] ABI WITH/WO TBI  OSA (obstructive sleep apnea)  Asymptomatic postmenopausal estrogen deficiency - Plan: DG Bone Density Re-schedule appt with cardiologist. Try your CPAP again. Take vit D daily. No changes in inhalers today.  If you need refills for medications you take chronically, please call your pharmacy. Do not use My Chart to request refills or for acute issues that need immediate attention. If you send a my chart message, it may take a few days to be addressed, specially if I am not in the office.  Please be sure medication list is accurate. If a new problem present, please set up appointment sooner than planned today.

## 2023-04-27 ENCOUNTER — Ambulatory Visit (HOSPITAL_COMMUNITY)
Admission: RE | Admit: 2023-04-27 | Discharge: 2023-04-27 | Disposition: A | Discharge status: Home / Self Care | Payer: BC Managed Care – PPO | Source: Ambulatory Visit | Attending: Family Medicine | Admitting: Family Medicine

## 2023-04-27 DIAGNOSIS — I739 Peripheral vascular disease, unspecified: Secondary | ICD-10-CM | POA: Diagnosis present

## 2023-05-01 LAB — VAS US ABI WITH/WO TBI
Left ABI: 0.96
Right ABI: 0.99

## 2023-10-23 ENCOUNTER — Ambulatory Visit: Payer: BC Managed Care – PPO | Admitting: Family Medicine

## 2023-11-07 ENCOUNTER — Encounter: Payer: Self-pay | Admitting: Family Medicine

## 2023-11-07 ENCOUNTER — Ambulatory Visit (INDEPENDENT_AMBULATORY_CARE_PROVIDER_SITE_OTHER): Admitting: Family Medicine

## 2023-11-07 VITALS — BP 128/80 | HR 100 | Temp 98.5°F | Resp 16 | Ht 63.0 in | Wt 299.0 lb

## 2023-11-07 DIAGNOSIS — E782 Mixed hyperlipidemia: Secondary | ICD-10-CM | POA: Diagnosis not present

## 2023-11-07 DIAGNOSIS — G4733 Obstructive sleep apnea (adult) (pediatric): Secondary | ICD-10-CM

## 2023-11-07 DIAGNOSIS — E559 Vitamin D deficiency, unspecified: Secondary | ICD-10-CM | POA: Diagnosis not present

## 2023-11-07 DIAGNOSIS — Z23 Encounter for immunization: Secondary | ICD-10-CM

## 2023-11-07 DIAGNOSIS — Z6841 Body Mass Index (BMI) 40.0 and over, adult: Secondary | ICD-10-CM

## 2023-11-07 DIAGNOSIS — I10 Essential (primary) hypertension: Secondary | ICD-10-CM | POA: Diagnosis not present

## 2023-11-07 LAB — BASIC METABOLIC PANEL WITH GFR
BUN: 10 mg/dL (ref 6–23)
CO2: 25 meq/L (ref 19–32)
Calcium: 8.8 mg/dL (ref 8.4–10.5)
Chloride: 109 meq/L (ref 96–112)
Creatinine, Ser: 0.79 mg/dL (ref 0.40–1.20)
GFR: 75.67 mL/min (ref >60.00)
Glucose, Bld: 95 mg/dL (ref 70–99)
Potassium: 3.6 meq/L (ref 3.5–5.1)
Sodium: 141 meq/L (ref 135–145)

## 2023-11-07 LAB — VITAMIN D 25 HYDROXY (VIT D DEFICIENCY, FRACTURES): VITD: 25.3 ng/mL — ABNORMAL LOW (ref 30.00–100.00)

## 2023-11-07 MED ORDER — OLMESARTAN MEDOXOMIL-HCTZ 20-12.5 MG PO TABS
1.0000 | ORAL_TABLET | Freq: Every day | ORAL | 2 refills | Status: AC
Start: 2023-11-07 — End: ?

## 2023-11-07 MED ORDER — METOPROLOL SUCCINATE ER 25 MG PO TB24: 25.0000 mg | ORAL_TABLET | Freq: Every day | ORAL | 2 refills | Status: DC

## 2023-11-07 NOTE — Assessment & Plan Note (Signed)
 Weight otherwise stable. She understands the benefits of wt loss as well as adverse effects of obesity. Consistency with healthy diet and physical activity encouraged. She agrees with trying to walk 10 minutes daily, can be increased as tolerated.

## 2023-11-07 NOTE — Patient Instructions (Addendum)
 A few things to remember from today's visit:  Hypertension, essential, benign - Plan: Basic metabolic panel with GFR, metoprolol  succinate (TOPROL -XL) 25 MG 24 hr tablet, olmesartan -hydrochlorothiazide (BENICAR  HCT) 20-12.5 MG tablet  Mixed hyperlipidemia  Vitamin D  deficiency, unspecified - Plan: VITAMIN D  25 Hydroxy (Vit-D Deficiency, Fractures)  OSA (obstructive sleep apnea)  Asthma in adult, mild intermittent, uncomplicated - Plan: olmesartan -hydrochlorothiazide (BENICAR  HCT) 20-12.5 MG tablet  10 min of daily walking and increased as tolerated. Call to schedule colonoscopy and to follow on sleep apnea. Try to take medications daily. Fasting labs next visit.  If you need refills for medications you take chronically, please call your pharmacy. Do not use My Chart to request refills or for acute issues that need immediate attention. If you send a my chart message, it may take a few days to be addressed, specially if I am not in the office.  Please be sure medication list is accurate. If a new problem present, please set up appointment sooner than planned today.

## 2023-11-07 NOTE — Progress Notes (Signed)
 HPI: Sonya Gilbert is a 73 y.o. female with a PMHx significant for HTN, asthma, OSA, HLD, OA, and vitamin D  deficiency, who is here today for chronic disease management.  Last seen on 04/24/2023  Hypertension: Currently on metoprolol  succinate 25 mg daily and olmesartan -hydrochlorothiazide 20-12.5 mg daily. She mentions she forgets her medication 2-3 times per week.  BP readings at home: She checks her BP regularly at home and says her readings are at goal as long as she remembers her medication, low 110's-120's/70's. Side effects: none Diet: She is eating out occasionally.  Exercise: She has not been exercising.  Vision: UTD on routine vision care.  Negative for unusual or severe headache, visual changes, exertional chest pain, dyspnea, palpitations, focal weakness, or edema.  Lab Results  Component Value Date   CREATININE 0.84 04/24/2023   BUN 11 04/24/2023   NA 142 04/24/2023   K 3.7 04/24/2023   CL 105 04/24/2023   CO2 24 04/24/2023   OSA:  She has not been using her CPAP. She is interested in trying a different treatment.  Vitamin D  deficiency:  Currently on vitamin D  2000 units daily.  Lab Results  Component Value Date   VD25OH 20.82 (L) 04/24/2023   Hyperlipidemia: She has not been taking rosuvastatin  10 mg. She says it was never given to her by the pharmacy.  Lab Results  Component Value Date   CHOL 196 04/24/2023   HDL 82.60 04/24/2023   LDLCALC 101 (H) 04/24/2023   TRIG 62.0 04/24/2023   CHOLHDL 2 04/24/2023   Review of Systems  Constitutional:  Negative for activity change, appetite change and fever.  Respiratory:  Negative for cough and wheezing.   Gastrointestinal:  Negative for abdominal pain, nausea and vomiting.  Genitourinary:  Negative for decreased urine volume, dysuria and hematuria.  Skin:  Negative for rash.  Neurological:  Negative for syncope and headaches.  Psychiatric/Behavioral:  Negative for confusion and hallucinations.   See  other pertinent positives and negatives in HPI.  Current Outpatient Medications on File Prior to Visit  Medication Sig Dispense Refill   Cholecalciferol 100 MCG (4000 UT) CAPS Take 2,000 Units by mouth daily.     [facility] OLOPATADINE  HCL 0.2 % SOLN APPLY 1 DROP TO EYE DAILY AS NEEDED. 2.5 mL 0   fluticasone  (FLONASE ) 50 MCG/ACT nasal spray Place 1 spray into both nostrils at bedtime. 18.2 mL 2   fluticasone -salmeterol (ADVAIR DISKUS) 250-50 MCG/ACT AEPB Inhale 1 puff into the lungs in the morning and at bedtime. 60 each 6   loratadine (CLARITIN) 10 MG tablet Take 10 mg by mouth daily.     sodium chloride  (OCEAN) 0.65 % SOLN nasal spray Place 2 sprays into both nostrils in the morning and at bedtime.  0   No current facility-administered medications on file prior to visit.   Past Medical History:  Diagnosis Date   Allergy    seasonal   Anemia    past hx    Arthritis    Asthma    Chicken pox    Chronic kidney disease    Family history of colon cancer in father    age 45 and sister age 34    GERD (gastroesophageal reflux disease)    History of frequent urinary tract infections    Hypertension    Obesity    Osteopenia    Peptic ulcer    history of PUD   Allergies  Allergen Reactions   Codeine Palpitations   Erythromycin Hives  and Nausea And Vomiting   Other     Bees, mold, grass, cats    Social History   Socioeconomic History   Marital status: Widowed    Spouse name: Not on file   Number of children: Not on file   Years of education: Not on file   Highest education level: Bachelor's degree (e.g., BA, AB, BS)  Occupational History   Not on file  Tobacco Use   Smoking status: Never   Smokeless tobacco: Never  Substance and Sexual Activity   Alcohol use: No    Alcohol/week: 1.0 standard drink of alcohol    Types: 1 Glasses of wine per week    Comment: 1 glass wine Q 3-4 months    Drug use: No   Sexual activity: Not on file  Other Topics Concern   Not on file  Social  History Narrative   Not on file   Social Drivers of Health   Financial Resource Strain: Low Risk  (09/21/2022)   Overall Financial Resource Strain (CARDIA)    Difficulty of Paying Living Expenses: Not very hard  Food Insecurity: No Food Insecurity (09/21/2022)   Hunger Vital Sign    Worried About Running Out of Food in the Last Year: Never true    Ran Out of Food in the Last Year: Never true  Transportation Needs: No Transportation Needs (09/21/2022)   PRAPARE - Administrator, Civil Service (Medical): No    Lack of Transportation (Non-Medical): No  Physical Activity: Unknown (09/21/2022)   Exercise Vital Sign    Days of Exercise per Week: 0 days    Minutes of Exercise per Session: Not on file  Stress: No Stress Concern Present (09/21/2022)   [employer] of Occupational Health - Occupational Stress Questionnaire    Feeling of Stress : Not at all  Social Connections: Moderately Integrated (09/21/2022)   Social Connection and Isolation Panel [NHANES]    Frequency of Communication with Friends and Family: More than three times a week    Frequency of Social Gatherings with Friends and Family: Twice a week    Attends Religious Services: More than 4 times per year    Active Member of [employer] or Organizations: Yes    Attends Banker Meetings: More than 4 times per year    Marital Status: Widowed    Vitals:   11/07/23 0731  BP: 128/80  Pulse: 100  Resp: 16  Temp: 98.5 F (36.9 C)  SpO2: 97%   Wt Readings from Last 3 Encounters:  11/07/23 299 lb (135.6 kg)  04/24/23 297 lb 8 oz (134.9 kg)  11/21/22 (!) 302 lb (137 kg)   Body mass index is 52.97 kg/m.  Physical Exam Vitals and nursing note reviewed.  Constitutional:      General: She is not in acute distress.    Appearance: She is well-developed.  HENT:     Head: Normocephalic and atraumatic.     Mouth/Throat:     Mouth: Mucous membranes are moist.     Pharynx: Oropharynx is clear. Uvula midline.   Eyes:     Conjunctiva/sclera: Conjunctivae normal.  Cardiovascular:     Rate and Rhythm: Normal rate and regular rhythm. Occasional Extrasystoles are present.    Pulses:          Posterior tibial pulses are 2+ on the right side and 2+ on the left side.     Heart sounds: No murmur heard. Pulmonary:     Effort: Pulmonary  effort is normal. No respiratory distress.     Breath sounds: Normal breath sounds.  Abdominal:     Palpations: Abdomen is soft. There is no mass.     Tenderness: There is no abdominal tenderness.  Musculoskeletal:     Right lower leg: No edema.     Left lower leg: No edema.  Lymphadenopathy:     Cervical: No cervical adenopathy.  Skin:    General: Skin is warm.     Findings: No erythema or rash.  Neurological:     General: No focal deficit present.     Mental Status: She is alert and oriented to person, place, and time.     Cranial Nerves: No cranial nerve deficit.     Gait: Gait normal.  Psychiatric:        Mood and Affect: Mood and affect normal.   ASSESSMENT AND PLAN:  Ms. Nass was seen today for chronic disease management.   Orders Placed This Encounter  Procedures   Pneumococcal conjugate vaccine 20-valent (Prevnar 20)   Basic metabolic panel with GFR   VITAMIN D  25 Hydroxy (Vit-D Deficiency, Fractures)   Lab Results  Component Value Date   NA 141 11/07/2023   CL 109 11/07/2023   K 3.6 11/07/2023   CO2 25 11/07/2023   BUN 10 11/07/2023   CREATININE 0.79 11/07/2023   GFR 75.67 11/07/2023   CALCIUM  8.8 11/07/2023   ALBUMIN 3.8 04/24/2023   GLUCOSE 95 11/07/2023   Lab Results  Component Value Date   VD25OH 25.30 (L) 11/07/2023   Morbid obesity with BMI of 50.0-59.9, adult (HCC) Assessment & Plan: Weight otherwise stable. She understands the benefits of wt loss as well as adverse effects of obesity. Consistency with healthy diet and physical activity encouraged. She agrees with trying to walk 10 minutes daily, can be increased as  tolerated.   Hypertension, essential, benign Assessment & Plan: Problem is otherwise adequately controlled. Stressed the importance of taking medications daily. Home BP readings 120's/70's. Continue Olmesartan -HCTZ 20-12.5 mg daily and metoprolol  succinate 25 mg daily. Continue monitoring BP regularly. Low-salt diet recommended. She has an eye exam scheduled. F/U in 6 months.  Orders: -     Basic metabolic panel with GFR; Future -     Metoprolol  Succinate ER; Take 1 tablet (25 mg total) by mouth daily.  Dispense: 90 tablet; Refill: 2 -     Olmesartan  Medoxomil-HCTZ; Take 1 tablet by mouth daily.  Dispense: 90 tablet; Refill: 2  Mixed hyperlipidemia Assessment & Plan: She is not taking rosuvastatin . Low-fat diet recommended. We can plan on fasting lipid panel at next visit.   Vitamin D  deficiency, unspecified Assessment & Plan: Continue OTC vitamin D  supplementation, 2000 units daily. Further recommendation will be given according to 25 OH vitamin D  result.  Orders: -     VITAMIN D  25 Hydroxy (Vit-D Deficiency, Fractures); Future  OSA (obstructive sleep apnea) Assessment & Plan: Not wearing CPAP. She is interested in inspire.  Recommend arranging appointment with sleep apnea provider to discuss options.   Need for pneumococcal vaccination -     Pneumococcal conjugate vaccine 20-valent   Return in about 6 months (around 05/08/2024) for chronic problems, Labs.  I, Odelia Bender, acting as a scribe for Dominiq Fontaine [place], MD., have documented all relevant documentation on the behalf of Jarman Litton [place], MD, as directed by  Judy Pollman [place], MD while in the presence of Kirti Carl [place], MD.   I, Sylvester Salonga [place], MD, have reviewed all documentation for  this visit. The documentation on 11/07/23 for the exam, diagnosis, procedures, and orders are all accurate and complete.  Nicoya Friel G. [place], MD  [facility]. [place] office.

## 2023-11-07 NOTE — Assessment & Plan Note (Addendum)
 Problem is otherwise adequately controlled. Stressed the importance of taking medications daily. Home BP readings 120's/70's. Continue Olmesartan -HCTZ 20-12.5 mg daily and metoprolol  succinate 25 mg daily. Continue monitoring BP regularly. Low-salt diet recommended. She has an eye exam scheduled. F/U in 6 months.

## 2023-11-07 NOTE — Assessment & Plan Note (Signed)
 She is not taking rosuvastatin . Low-fat diet recommended. We can plan on fasting lipid panel at next visit.

## 2023-11-07 NOTE — Assessment & Plan Note (Signed)
 Continue OTC vitamin D supplementation, 2000 units daily. Further recommendation will be given according to 25 OH vitamin D result.

## 2023-11-07 NOTE — Assessment & Plan Note (Signed)
 Not wearing CPAP. She is interested in inspire.  Recommend arranging appointment with sleep apnea provider to discuss options.

## 2024-03-21 ENCOUNTER — Telehealth (INDEPENDENT_AMBULATORY_CARE_PROVIDER_SITE_OTHER): Admitting: Family Medicine

## 2024-03-21 DIAGNOSIS — B9789 Other viral agents as the cause of diseases classified elsewhere: Secondary | ICD-10-CM

## 2024-03-21 DIAGNOSIS — R059 Cough, unspecified: Secondary | ICD-10-CM | POA: Diagnosis not present

## 2024-03-21 DIAGNOSIS — J988 Other specified respiratory disorders: Secondary | ICD-10-CM | POA: Diagnosis not present

## 2024-03-21 DIAGNOSIS — J4520 Mild intermittent asthma, uncomplicated: Secondary | ICD-10-CM

## 2024-03-21 MED ORDER — BENZONATATE 100 MG PO CAPS: 100.0000 mg | ORAL_CAPSULE | Freq: Three times a day (TID) | ORAL | 0 refills | Status: DC | PRN

## 2024-03-21 NOTE — Progress Notes (Signed)
 Virtual Visit via Video Note  I connected with Sonya Gilbert on 03/21/24 at 5:02 PM by a video enabled telemedicine application and verified that I am speaking with the correct person using two identifiers.  Patient location: home - by self My location: office - [place].    I discussed the limitations, risks, security and privacy concerns of performing an evaluation and management service by telephone and the availability of in person appointments. I also discussed with the patient that there may be a patient responsible charge related to this service. The patient expressed understanding and agreed to proceed, consent obtained  Chief complaint:  Chief Complaint  Patient presents with   Sore Throat    SOB, chills, congestion, sore throat, loss of appetite, grandchild was sick in her home this weekend, discolored mucous     History of Present Illness: Sonya Gilbert is a 73 y.o. female Acute visit for above, PCP is [place], Betty G, MD  Cough, sore throat Sick contact over the weekend - grandbaby sick with cold symptoms. No known exposure to covid. Her son is also sick with similar sx's.  She started feeling sick with scratchy throat Monday night (3 nights ago). Cough, runny nose, sneezing, thick yellow mucus past 3 days.  No fever.  Some dyspnea with coughing fits only, no dyspnea with activity.  Hx of asthma. Has been using her advair as needed. Has used twice in past week. Taking flonase  and claritin daily. Possible wheeze yesterday. Has not used advair yesterday or today. Has covid test at home - has not tested. Some chest congestion - yellow phelgm and nasal congestion. Feels slightly better today.  HA across forehead today.  Drinking fluids.  No chest pain (only sore with cough), no confusion.  Trouble with sleep d/t cough, and congestion with lying down. Tx: mucinex, robitussin, vitamin C.     Patient Active Problem List   Diagnosis Date Noted   Non-seasonal  allergic rhinitis 09/21/2022   OSA (obstructive sleep apnea) 08/31/2021   Asthma in adult, mild intermittent, uncomplicated 10/28/2020   Localized osteoarthritis of knees, bilateral 04/10/2017   Hyperlipidemia 12/30/2016   Hypertension, essential, benign 12/30/2016   Vitamin D  deficiency, unspecified 12/30/2016   Morbid obesity with BMI of 50.0-59.9, adult (HCC) 12/30/2016   Varicose veins of both legs with edema 04/30/2013   Edema 01/15/2013   Discoloration of skin of lower leg-Left Lateral  01/01/2013   Pain in limb-Right medial leg 01/01/2013   Past Medical History:  Diagnosis Date   Allergy    seasonal   Anemia    past hx    Arthritis    Asthma    Chicken pox    Chronic kidney disease    Family history of colon cancer in father    age 45 and sister age 71    GERD (gastroesophageal reflux disease)    History of frequent urinary tract infections    Hypertension    Obesity    Osteopenia    Peptic ulcer    history of PUD   Past Surgical History:  Procedure Laterality Date   ABDOMINAL HYSTERECTOMY  1996   BREAST BIOPSY Right    BREAST SURGERY  2014   biopsy   COLONOSCOPY  2004   [facility]- normal    TONSILLECTOMY     At 73 yrs old   Allergies  Allergen Reactions   Codeine Palpitations   Erythromycin Hives and Nausea And Vomiting   Other  Bees, mold, grass, cats    Prior to Admission medications   Medication Sig Start Date End Date Taking? Authorizing Provider  Cholecalciferol 100 MCG (4000 UT) CAPS Take 2,000 Units by mouth daily.   Yes [provider]  [facility] OLOPATADINE  HCL 0.2 % SOLN APPLY 1 DROP TO EYE DAILY AS NEEDED. 10/19/22  Yes [place], Betty G, MD  fluticasone  (FLONASE ) 50 MCG/ACT nasal spray Place 1 spray into both nostrils at bedtime. 02/04/22  Yes Turner, Wilbert SAUNDERS, MD  fluticasone -salmeterol (ADVAIR DISKUS) 250-50 MCG/ACT AEPB Inhale 1 puff into the lungs in the morning and at bedtime. 08/23/22  Yes [place], Betty G, MD  loratadine (CLARITIN)  10 MG tablet Take 10 mg by mouth daily.   Yes [provider]  metoprolol  succinate (TOPROL -XL) 25 MG 24 hr tablet Take 1 tablet (25 mg total) by mouth daily. 11/07/23  Yes [place], Betty G, MD  olmesartan -hydrochlorothiazide (BENICAR  HCT) 20-12.5 MG tablet Take 1 tablet by mouth daily. 11/07/23  Yes [place], Betty G, MD  sodium chloride  (OCEAN) 0.65 % SOLN nasal spray Place 2 sprays into both nostrils in the morning and at bedtime. 02/04/22  Yes Shlomo Wilbert SAUNDERS, MD   Social History   Socioeconomic History   Marital status: Widowed    Spouse name: Not on file   Number of children: Not on file   Years of education: Not on file   Highest education level: Bachelor's degree (e.g., BA, AB, BS)  Occupational History   Not on file  Tobacco Use   Smoking status: Never   Smokeless tobacco: Never  Substance and Sexual Activity   Alcohol use: No    Alcohol/week: 1.0 standard drink of alcohol    Types: 1 Glasses of wine per week    Comment: 1 glass wine Q 3-4 months    Drug use: No   Sexual activity: Not on file  Other Topics Concern   Not on file  Social History Narrative   Not on file   Social Drivers of Health   Financial Resource Strain: Low Risk  (09/21/2022)   Overall Financial Resource Strain (CARDIA)    Difficulty of Paying Living Expenses: Not very hard  Food Insecurity: No Food Insecurity (09/21/2022)   Hunger Vital Sign    Worried About Running Out of Food in the Last Year: Never true    Ran Out of Food in the Last Year: Never true  Transportation Needs: No Transportation Needs (09/21/2022)   PRAPARE - Administrator, Civil Service (Medical): No    Lack of Transportation (Non-Medical): No  Physical Activity: Unknown (09/21/2022)   Exercise Vital Sign    Days of Exercise per Week: 0 days    Minutes of Exercise per Session: Not on file  Stress: No Stress Concern Present (09/21/2022)   [employer] of Occupational Health - Occupational Stress Questionnaire     Feeling of Stress : Not at all  Social Connections: Moderately Integrated (09/21/2022)   Social Connection and Isolation Panel    Frequency of Communication with Friends and Family: More than three times a week    Frequency of Social Gatherings with Friends and Family: Twice a week    Attends Religious Services: More than 4 times per year    Active Member of [employer] or Organizations: Yes    Attends Banker Meetings: More than 4 times per year    Marital Status: Widowed  Intimate Partner Violence: Not on file    Observations/Objective: There  were no vitals filed for this visit. Speaking full sentences without respiratory distress, nontoxic appearance.  Appropriate responses.  Home COVID test reported as negative.  Assessment and Plan: Cough, unspecified type - Plan: benzonatate  (TESSALON  PERLES) 100 MG capsule  Asthma in adult, mild intermittent, uncomplicated - Plan: benzonatate  (TESSALON  PERLES) 100 MG capsule  Viral respiratory illness - Plan: benzonatate  (TESSALON  PERLES) 100 MG capsule Suspected viral illness with other sick contacts with similar symptoms.  Home COVID testing performed after phone call was negative on my repeat call back to her this evening.  Does report some possible slight improvement in symptoms today.  Denies dyspnea with activity, only during coughing fits.  History of asthma, but infrequent use of Advair.  - Based on above I think it would be reasonable to continue home treatment at this time with restarting consistent use of Advair for asthma treatment.  Flonase  or Ocean nasal spray for nasal congestion.  Tessalon  Perles were ordered if needed for cough, if ineffective with Mucinex or Robitussin, but some of her cough may be related to her asthma.  Unlikely bacterial illness based on time, and with slight improvement, but advised her to be seen in person tomorrow or Saturday if not continuing to improve or if any worsening sooner.  Understanding  expressed.  Follow Up Instructions:  As needed, with precautions below.  Patient Instructions  Good talking with you today.  I am sorry that you are sick but it sounds like this is probably a virus and may be starting to improve.  I do recommend you take the Advair to help treat the asthma as that can contribute to the cough.  Flonase  nasal spray or Ocean saline nasal spray can also help with the congestion.  Okay to continue Mucinex if needed for cough but the Tessalon  Perles sent to your pharmacy are also an option.  Continue to drink plenty of fluids and rest.  If any shortness of breath with activity, worsening symptoms, or you are not continuing to improve in the next day or two, I do recommend being seen in person.  Urgent care or ER is an option over the weekend if needed.  Please let us  know if there are questions and hope you feel much better soon!  Dr. Levora  I discussed the assessment and treatment plan with the patient. The patient was provided an opportunity to ask questions and all were answered. The patient agreed with the plan and demonstrated an understanding of the instructions.   The patient was advised to call back or seek an in-person evaluation if the symptoms worsen or if the condition fails to improve as anticipated.   Reyes JONELLE Levora, MD

## 2024-03-21 NOTE — Patient Instructions (Addendum)
 Good talking with you today.  I am sorry that you are sick but it sounds like this is probably a virus and may be starting to improve.  I do recommend you take the Advair to help treat the asthma as that can contribute to the cough.  Flonase  nasal spray or Ocean saline nasal spray can also help with the congestion.  Okay to continue Mucinex if needed for cough but the Tessalon  Perles sent to your pharmacy are also an option.  Continue to drink plenty of fluids and rest.  If any shortness of breath with activity, worsening symptoms, or you are not continuing to improve in the next day or two, I do recommend being seen in person.  Urgent care or ER is an option over the weekend if needed.  Please let us  know if there are questions and hope you feel much better soon!  Dr. Levora

## 2024-05-07 ENCOUNTER — Ambulatory Visit (INDEPENDENT_AMBULATORY_CARE_PROVIDER_SITE_OTHER): Admitting: Family Medicine

## 2024-05-07 VITALS — BP 126/78 | HR 90 | Temp 97.9°F | Resp 16 | Ht 63.0 in | Wt 303.4 lb

## 2024-05-07 DIAGNOSIS — J4520 Mild intermittent asthma, uncomplicated: Secondary | ICD-10-CM

## 2024-05-07 DIAGNOSIS — Z1211 Encounter for screening for malignant neoplasm of colon: Secondary | ICD-10-CM

## 2024-05-07 DIAGNOSIS — I10 Essential (primary) hypertension: Secondary | ICD-10-CM | POA: Diagnosis not present

## 2024-05-07 DIAGNOSIS — E782 Mixed hyperlipidemia: Secondary | ICD-10-CM

## 2024-05-07 DIAGNOSIS — R053 Chronic cough: Secondary | ICD-10-CM

## 2024-05-07 DIAGNOSIS — E559 Vitamin D deficiency, unspecified: Secondary | ICD-10-CM

## 2024-05-07 DIAGNOSIS — G4733 Obstructive sleep apnea (adult) (pediatric): Secondary | ICD-10-CM

## 2024-05-07 LAB — COMPREHENSIVE METABOLIC PANEL WITH GFR
ALT: 10 U/L (ref 0–35)
AST: 13 U/L (ref 0–37)
Albumin: 3.8 g/dL (ref 3.5–5.2)
Alkaline Phosphatase: 80 U/L (ref 39–117)
BUN: 12 mg/dL (ref 6–23)
CO2: 26 meq/L (ref 19–32)
Calcium: 8.8 mg/dL (ref 8.4–10.5)
Chloride: 106 meq/L (ref 96–112)
Creatinine, Ser: 0.73 mg/dL (ref 0.40–1.20)
GFR: 82.91 mL/min (ref >60.00)
Glucose, Bld: 84 mg/dL (ref 70–99)
Potassium: 4.1 meq/L (ref 3.5–5.1)
Sodium: 142 meq/L (ref 135–145)
Total Bilirubin: 0.8 mg/dL (ref 0.2–1.2)
Total Protein: 6.9 g/dL (ref 6.0–8.3)

## 2024-05-07 LAB — CBC
HCT: 40.8 % (ref 36.0–46.0)
Hemoglobin: 13.3 g/dL (ref 12.0–15.0)
MCHC: 32.5 g/dL (ref 30.0–36.0)
MCV: 82.9 fl (ref 78.0–100.0)
Platelets: 322 K/uL (ref 150.0–400.0)
RBC: 4.92 Mil/uL (ref 3.87–5.11)
RDW: 14.2 % (ref 11.5–15.5)
WBC: 6.8 K/uL (ref 4.0–10.5)

## 2024-05-07 LAB — LIPID PANEL
Cholesterol: 182 mg/dL (ref 0–200)
HDL: 77 mg/dL (ref >39.00)
LDL Cholesterol: 94 mg/dL (ref 0–99)
NonHDL: 104.75
Total CHOL/HDL Ratio: 2
Triglycerides: 52 mg/dL (ref 0.0–149.0)
VLDL: 10.4 mg/dL (ref 0.0–40.0)

## 2024-05-07 LAB — VITAMIN D 25 HYDROXY (VIT D DEFICIENCY, FRACTURES): VITD: 24.26 ng/mL — ABNORMAL LOW (ref 30.00–100.00)

## 2024-05-07 MED ORDER — FLUTICASONE-SALMETEROL 250-50 MCG/ACT IN AEPB: 1.0000 | INHALATION_SPRAY | Freq: Two times a day (BID) | RESPIRATORY_TRACT | 6 refills | Status: AC

## 2024-05-07 NOTE — Assessment & Plan Note (Signed)
 Currently on nonpharmacologic treatment. Further recommendation will be given according to lipid panel result.

## 2024-05-07 NOTE — Patient Instructions (Addendum)
 A few things to remember from today's visit:  Mixed hyperlipidemia - Plan: Comprehensive metabolic panel with GFR, Lipid panel  Asthma in adult, mild intermittent, uncomplicated - Plan: fluticasone -salmeterol (ADVAIR DISKUS) 250-50 MCG/ACT AEPB  Hypertension, essential, benign - Plan: Comprehensive metabolic panel with GFR  Vitamin D  deficiency, unspecified - Plan: VITAMIN D  25 Hydroxy (Vit-D Deficiency, Fractures)  Persistent cough for 3 weeks or longer - Plan: DG Chest 2 View, CBC  Colon cancer screening - Plan: Ambulatory referral to Gastroenterology Use advair 1 puff 2 times daily for 2-3 months, rinse after use, once wheezing resolves continue as needed. Order for chest X ray placed.  No changes in rest today.  If you need refills for medications you take chronically, please call your pharmacy. Do not use My Chart to request refills or for acute issues that need immediate attention. If you send a my chart message, it may take a few days to be addressed, specially if I am not in the office.  Please be sure medication list is accurate. If a new problem present, please set up appointment sooner than planned today.

## 2024-05-07 NOTE — Assessment & Plan Note (Signed)
 Problem is well controlled, home BP readings 120's/70's. Continue Olmesartan -HCTZ 20-12.5 mg daily  has not been taking Metoprolol  succinate in several months. Continue monitoring BP regularly. Low-salt diet recommended. F/U in 6 months.

## 2024-05-07 NOTE — Assessment & Plan Note (Signed)
 Continue current dose of vitamin D supplementation. Further recommendation will be given according to 25 OH vitamin D result.

## 2024-05-07 NOTE — Assessment & Plan Note (Signed)
 Having intermittent episodes of wheezing and cough since URI in early 03/2024. Lung auscultation today negative. Recommend taking Advair 250-50 mcg 1 puff twice daily for 2 to 3 months then go back as needed. Chest x-ray ordered.

## 2024-05-07 NOTE — Assessment & Plan Note (Signed)
 She is not wearing a CPAP machine. We discussed possible complications related to this problem with is not appropriately treated. Weight loss definitely will help.

## 2024-05-07 NOTE — Progress Notes (Unsigned)
 Chief Complaint  Patient presents with   Medical Management of Chronic Issues    Six month follow-up    Discussed the use of AI scribe software for clinical note transcription with the patient, who gave verbal consent to proceed.  History of Present Illness Sonya Gilbert is a 73 year old female with a PMHx significant for HTN, asthma, OSA, HLD, OA, and vitamin D  deficiency here today for follow up. She was last seen on 11/07/23.  She has experienced a persistent cough and wheezing since a viral illness in September, coinciding with her birthday. The cough persists, especially in the mornings, with thick yellow mucus. Wheezing occurs once or twice daily.  Asthma: She uses Advair almost daily, though not consistently twice a day as prescribed. She requests a refill for Advair 250-50 mcg. Negative for fever,chills, or SOB.  Vit D deficiency: She takes over-the-counter vitamin D , two capsules of 250 mcg each, daily.  25 OH vit D was 25.3 in 10/2023. She mentions that her urine sometimes has a strong odor but denies any burning, blood, or discharge. She drinks a lot of water for hydration.  She believes she has lost weight as her clothes feel looser. She does not exercise regularly, trying to eat healthier. She attributes her physical activity to taking care of her grandchild.  Hypertension: She  takes Olmesartan -hydrochlorothiazide 20-12.5 mg daily.  She is not taking metoprolol  succinate, which was previously prescribed but not taken.  Her home blood pressure readings range from 120/76 to 130, with higher readings when she forgets her medication. She occasionally forgets her medication, which she tries to take with lunch to avoid an empty stomach. Hx of PAC's. Negative for unusual or severe headache, visual changes, exertional chest pain, focal weakness, or worsening edema. OSA: She does not wear a CPAP, did not tolerated well.  Lab Results  Component Value Date   NA 141 11/07/2023    CL 109 11/07/2023   K 3.6 11/07/2023   CO2 25 11/07/2023   BUN 10 11/07/2023   CREATININE 0.79 11/07/2023   GFR 75.67 11/07/2023   CALCIUM  8.8 11/07/2023   ALBUMIN 3.8 04/24/2023   GLUCOSE 95 11/07/2023   Hyperlipidemia:On non pharmacologic treatment.  Lab Results  Component Value Date   CHOL 196 04/24/2023   HDL 82.60 04/24/2023   LDLCALC 101 (H) 04/24/2023   TRIG 62.0 04/24/2023   CHOLHDL 2 04/24/2023   Review of Systems  Constitutional:  Negative for activity change and appetite change.  HENT:  Positive for postnasal drip. Negative for sore throat.   Gastrointestinal:  Negative for abdominal pain, nausea and vomiting.  Endocrine: Negative for cold intolerance and heat intolerance.  Genitourinary:  Negative for decreased urine volume, dysuria and hematuria.  Skin:  Negative for rash.  Allergic/Immunologic: Positive for environmental allergies.  Neurological:  Negative for syncope and facial asymmetry.  See other pertinent positives and negatives in HPI.  Current Outpatient Medications on File Prior to Visit  Medication Sig Dispense Refill   Cholecalciferol 100 MCG (4000 UT) CAPS Take 2,000 Units by mouth daily.     [facility] OLOPATADINE  HCL 0.2 % SOLN APPLY 1 DROP TO EYE DAILY AS NEEDED. 2.5 mL 0   fluticasone  (FLONASE ) 50 MCG/ACT nasal spray Place 1 spray into both nostrils at bedtime. 18.2 mL 2   fluticasone -salmeterol (ADVAIR DISKUS) 250-50 MCG/ACT AEPB Inhale 1 puff into the lungs in the morning and at bedtime. 60 each 6   loratadine (CLARITIN) 10 MG tablet Take  10 mg by mouth daily.     metoprolol  succinate (TOPROL -XL) 25 MG 24 hr tablet Take 1 tablet (25 mg total) by mouth daily. 90 tablet 2   olmesartan -hydrochlorothiazide (BENICAR  HCT) 20-12.5 MG tablet Take 1 tablet by mouth daily. 90 tablet 2   sodium chloride  (OCEAN) 0.65 % SOLN nasal spray Place 2 sprays into both nostrils in the morning and at bedtime.  0   No current facility-administered medications on file prior  to visit.   Past Medical History:  Diagnosis Date   Allergy    seasonal   Anemia    past hx    Arthritis    Asthma    Chicken pox    Chronic kidney disease    Family history of colon cancer in father    age 69 and sister age 30    GERD (gastroesophageal reflux disease)    History of frequent urinary tract infections    Hypertension    Obesity    Osteopenia    Peptic ulcer    history of PUD    Allergies  Allergen Reactions   Codeine Palpitations   Erythromycin Hives and Nausea And Vomiting   Other     Bees, mold, grass, cats     Social History   Socioeconomic History   Marital status: Widowed    Spouse name: Not on file   Number of children: Not on file   Years of education: Not on file   Highest education level: Bachelor's degree (e.g., BA, AB, BS)  Occupational History   Not on file  Tobacco Use   Smoking status: Never   Smokeless tobacco: Never  Substance and Sexual Activity   Alcohol use: No    Alcohol/week: 1.0 standard drink of alcohol    Types: 1 Glasses of wine per week    Comment: 1 glass wine Q 3-4 months    Drug use: No   Sexual activity: Not on file  Other Topics Concern   Not on file  Social History Narrative   Not on file   Social Drivers of Health   Financial Resource Strain: Low Risk  (09/21/2022)   Overall Financial Resource Strain (CARDIA)    Difficulty of Paying Living Expenses: Not very hard  Food Insecurity: No Food Insecurity (09/21/2022)   Hunger Vital Sign    Worried About Running Out of Food in the Last Year: Never true    Ran Out of Food in the Last Year: Never true  Transportation Needs: No Transportation Needs (09/21/2022)   PRAPARE - Administrator, Civil Service (Medical): No    Lack of Transportation (Non-Medical): No  Physical Activity: Unknown (09/21/2022)   Exercise Vital Sign    Days of Exercise per Week: 0 days    Minutes of Exercise per Session: Not on file  Stress: No Stress Concern Present (09/21/2022)    [employer] of Occupational Health - Occupational Stress Questionnaire    Feeling of Stress : Not at all  Social Connections: Moderately Integrated (09/21/2022)   Social Connection and Isolation Panel    Frequency of Communication with Friends and Family: More than three times a week    Frequency of Social Gatherings with Friends and Family: Twice a week    Attends Religious Services: More than 4 times per year    Active Member of [employer] or Organizations: Yes    Attends Banker Meetings: More than 4 times per year    Marital Status: Widowed  Today's Vitals   05/07/24 0822  BP: 126/78  Pulse: 90  Resp: 16  Temp: 97.9 F (36.6 C)  SpO2: 98%  Weight: (!) 303 lb 6.4 oz (137.6 kg)  Height: 5' 3 (1.6 m)   Body mass index is 53.74 kg/m.  Wt Readings from Last 3 Encounters:  05/07/24 (!) 303 lb 6.4 oz (137.6 kg)  11/07/23 299 lb (135.6 kg)  04/24/23 297 lb 8 oz (134.9 kg)   Body mass index is 53.74 kg/m.  Physical Exam Vitals and nursing note reviewed.  Constitutional:      General: She is not in acute distress.    Appearance: She is well-developed.  HENT:     Head: Normocephalic and atraumatic.     Mouth/Throat:     Mouth: Mucous membranes are moist.     Pharynx: Oropharynx is clear.  Eyes:     Conjunctiva/sclera: Conjunctivae normal.  Cardiovascular:     Rate and Rhythm: Normal rate. Rhythm irregular. Occasional Extrasystoles are present.    Pulses:          Dorsalis pedis pulses are 2+ on the right side and 2+ on the left side.     Heart sounds: No murmur heard.    Comments: Trace pitting LE edema bilateral and pedal non pitting. Pulmonary:     Effort: Pulmonary effort is normal. No respiratory distress.     Breath sounds: Normal breath sounds.  Abdominal:     Palpations: Abdomen is soft. There is no hepatomegaly or mass.     Tenderness: There is no abdominal tenderness.  Lymphadenopathy:     Cervical: No cervical adenopathy.  Skin:    General:  Skin is warm.     Findings: No erythema or rash.  Neurological:     General: No focal deficit present.     Mental Status: She is alert and oriented to person, place, and time.     Cranial Nerves: No cranial nerve deficit.     Gait: Gait normal.  Psychiatric:        Mood and Affect: Mood and affect normal.   ASSESSMENT AND PLAN:  Ms. Davine Sweney was seen today for medical management of chronic issues.  Diagnoses and all orders for this visit: Orders Placed This Encounter  Procedures   DG Chest 2 View   Comprehensive metabolic panel with GFR   Lipid panel   VITAMIN D  25 Hydroxy (Vit-D Deficiency, Fractures)   CBC   Ambulatory referral to Gastroenterology   Lab Results  Component Value Date   VD25OH 24.26 (L) 05/07/2024   Lab Results  Component Value Date   NA 142 05/07/2024   CL 106 05/07/2024   K 4.1 05/07/2024   CO2 26 05/07/2024   BUN 12 05/07/2024   CREATININE 0.73 05/07/2024   GFR 82.91 05/07/2024   CALCIUM  8.8 05/07/2024   ALBUMIN 3.8 05/07/2024   GLUCOSE 84 05/07/2024   Lab Results  Component Value Date   ALT 10 05/07/2024   AST 13 05/07/2024   ALKPHOS 80 05/07/2024   BILITOT 0.8 05/07/2024   Lab Results  Component Value Date   CHOL 182 05/07/2024   HDL 77.00 05/07/2024   LDLCALC 94 05/07/2024   TRIG 52.0 05/07/2024   CHOLHDL 2 05/07/2024   Lab Results  Component Value Date   WBC 6.8 05/07/2024   HGB 13.3 05/07/2024   HCT 40.8 05/07/2024   MCV 82.9 05/07/2024   PLT 322.0 05/07/2024   Asthma in adult, mild intermittent, uncomplicated  Assessment & Plan: Having intermittent episodes of wheezing and cough since URI in early 03/2024. Lung auscultation today negative. Recommend taking Advair 250-50 mcg 1 puff twice daily for 2 to 3 months then go back as needed. Chest x-ray ordered.  Orders: -     Fluticasone -Salmeterol; Inhale 1 puff into the lungs in the morning and at bedtime.  Dispense: 60 each; Refill: 6  Mixed hyperlipidemia Assessment  & Plan: Currently on nonpharmacologic treatment. Further recommendation will be given according to lipid panel result.  Orders: -     Comprehensive metabolic panel with GFR; Future -     Lipid panel; Future  Hypertension, essential, benign Assessment & Plan: Problem is well controlled, home BP readings 120's/70's. Continue Olmesartan -HCTZ 20-12.5 mg daily  has not been taking Metoprolol  succinate in several months. Continue monitoring BP regularly. Low-salt diet recommended. F/U in 6 months.  Orders: -     Comprehensive metabolic panel with GFR; Future  Vitamin D  deficiency, unspecified Assessment & Plan: Continue current dose of vitamin D  supplementation. Further recommendation will be given according to 25 OH vitamin D  result.  Orders: -     VITAMIN D  25 Hydroxy (Vit-D Deficiency, Fractures); Future  Persistent cough for 3 weeks or longer Lung auscultation negative. Explained that cough and post nasal drip can last a few more days and even week after acute symptoms have resolved. She reports gradual improvement. Further recommendations according to CXR report.  -     DG Chest 2 View; Future -     CBC; Future  OSA (obstructive sleep apnea) Assessment & Plan: She is not wearing a CPAP machine. We discussed possible complications related to this problem with is not appropriately treated. Weight loss definitely will help.  Colon cancer screening -     Ambulatory referral to Gastroenterology  Return in about 6 months (around 11/05/2024) for chronic problems.  Alanee Ting [place], MD [facility]. [place] office.

## 2024-05-08 ENCOUNTER — Ambulatory Visit: Admitting: Family Medicine

## 2024-05-09 ENCOUNTER — Ambulatory Visit: Payer: Self-pay | Admitting: Family Medicine

## 2024-05-09 ENCOUNTER — Encounter: Payer: Self-pay | Admitting: Family Medicine

## 2024-07-24 ENCOUNTER — Ambulatory Visit: Admitting: Family Medicine

## 2024-07-24 ENCOUNTER — Encounter: Payer: Self-pay | Admitting: Family Medicine

## 2024-07-24 VITALS — BP 126/84 | HR 100 | Temp 97.7°F | Resp 16 | Ht 63.0 in | Wt 297.6 lb

## 2024-07-24 DIAGNOSIS — J4520 Mild intermittent asthma, uncomplicated: Secondary | ICD-10-CM

## 2024-07-24 DIAGNOSIS — J069 Acute upper respiratory infection, unspecified: Secondary | ICD-10-CM

## 2024-07-24 MED ORDER — PREDNISONE 20 MG PO TABS: 40.0000 mg | ORAL_TABLET | Freq: Every day | ORAL | 0 refills | Status: AC

## 2024-07-24 MED ORDER — DOXYCYCLINE HYCLATE 100 MG PO TABS: 100.0000 mg | ORAL_TABLET | Freq: Two times a day (BID) | ORAL | 0 refills | Status: AC

## 2024-07-24 MED ORDER — BENZONATATE 100 MG PO CAPS: 100.0000 mg | ORAL_CAPSULE | Freq: Two times a day (BID) | ORAL | 0 refills | Status: AC | PRN

## 2024-07-24 MED ORDER — ALBUTEROL SULFATE HFA 108 (90 BASE) MCG/ACT IN AERS: 2.0000 | INHALATION_SPRAY | Freq: Four times a day (QID) | RESPIRATORY_TRACT | 0 refills | Status: AC | PRN

## 2024-07-24 NOTE — Progress Notes (Signed)
 "  ACUTE VISIT Chief Complaint  Patient presents with   Cough    And mucus x 5 days - not getting any better - Tried OTC Robitussin,claritin, Vit C and inhalers    Discussed the use of AI scribe software for clinical note transcription with the patient, who gave verbal consent to proceed.  History of Present Illness Sonya Gilbert is a 74 year old female PMHx significant for HTN, asthma, OSA, HLD, OA, and vitamin D  deficiency  who presents with respiratory symptoms for five days.  She has been experiencing a productive cough with thick, mucousy sputum for five days, severe enough to induce vomiting on three occasions. The cough is accompanied by chest soreness and episodes of breathlessness due to coughing spells. She also experiences wheezing but no significant difficulty breathing.  Her symptoms began with a scratchy throat, which progressed to persistent coughing. No fever or chills are present, but she reports headaches in the occipital and right parietal regions. The scratchy throat triggers coughing spells, and she notes chest tightness on Monday and Tuesday, feeling as if something was pressing on her chest.  No exertional CP, palpitations,or diaphoresis. No body aches have been experienced.  She has not been tested for flu or COVID-19.  No recent travel or sick contact.  Her current medications include Robitussin Severe Cold, Flu, and Cough, vitamin C, hot teas, allergy medicine, and an inhaler. She uses Advair (Fluticasone /Salmeterol) 250-50 mcg x 2 daily, which she finds helpful. She does not have albuterol  at home.  She sates that every three to four months, she experiences similar respiratory issues lasting a few days. She has not taken prednisone  recently but recalls its past effectiveness.   Review of Systems  Constitutional:  Positive for activity change, appetite change and fatigue.  HENT:  Positive for congestion, postnasal drip and rhinorrhea.   Eyes:  Negative for  discharge and redness.  Respiratory:  Positive for cough.   Gastrointestinal:  Negative for abdominal pain and diarrhea.  Genitourinary:  Negative for decreased urine volume, dysuria and hematuria.  Skin:  Negative for rash.  Allergic/Immunologic: Positive for environmental allergies.  Neurological:  Negative for syncope, facial asymmetry and weakness.  See other pertinent positives and negatives in HPI.  Medications Ordered Prior to Encounter[1]  Past Medical History:  Diagnosis Date   Allergy    seasonal   Anemia    past hx    Arthritis    Asthma    Chicken pox    Chronic kidney disease    Family history of colon cancer in father    age 48 and sister age 4    GERD (gastroesophageal reflux disease)    History of frequent urinary tract infections    Hypertension    Obesity    Osteopenia    Peptic ulcer    history of PUD   Allergies[2]  Social History   Socioeconomic History   Marital status: Widowed    Spouse name: Not on file   Number of children: Not on file   Years of education: Not on file   Highest education level: Bachelor's degree (e.g., BA, AB, BS)  Occupational History   Not on file  Tobacco Use   Smoking status: Never   Smokeless tobacco: Never  Substance and Sexual Activity   Alcohol use: No    Alcohol/week: 1.0 standard drink of alcohol    Types: 1 Glasses of wine per week    Comment: 1 glass wine Q 3-4 months  Drug use: No   Sexual activity: Not on file  Other Topics Concern   Not on file  Social History Narrative   Not on file   Social Drivers of Health   Tobacco Use: Low Risk (07/24/2024)   Patient History    Smoking Tobacco Use: Never    Smokeless Tobacco Use: Never    Passive Exposure: Not on file  Financial Resource Strain: Low Risk (09/21/2022)   Overall Financial Resource Strain (CARDIA)    Difficulty of Paying Living Expenses: Not very hard  Food Insecurity: No Food Insecurity (09/21/2022)   Hunger  Vital Sign    Worried About Running Out of Food in the Last Year: Never true    Ran Out of Food in the Last Year: Never true  Transportation Needs: No Transportation Needs (09/21/2022)   PRAPARE - Administrator, Civil Service (Medical): No    Lack of Transportation (Non-Medical): No  Physical Activity: Unknown (09/21/2022)   Exercise Vital Sign    Days of Exercise per Week: 0 days    Minutes of Exercise per Session: Not on file  Stress: No Stress Concern Present (09/21/2022)   [employer] of Occupational Health - Occupational Stress Questionnaire    Feeling of Stress : Not at all  Social Connections: Moderately Integrated (09/21/2022)   Social Connection and Isolation Panel    Frequency of Communication with Friends and Family: More than three times a week    Frequency of Social Gatherings with Friends and Family: Twice a week    Attends Religious Services: More than 4 times per year    Active Member of [employer] or Organizations: Yes    Attends Banker Meetings: More than 4 times per year    Marital Status: Widowed  Depression (PHQ2-9): Low Risk (11/07/2023)   Depression (PHQ2-9)    PHQ-2 Score: 3  Alcohol Screen: Low Risk (09/21/2022)   Alcohol Screen    Last Alcohol Screening Score (AUDIT): 2  Housing: Low Risk (09/21/2022)   Housing    Last Housing Risk Score: 0  Utilities: Not on file  Health Literacy: Not on file    Vitals:   07/24/24 1532  BP: 126/84  Pulse: 100  Resp: 16  Temp: 97.7 F (36.5 C)  SpO2: 94%   Body mass index is 52.72 kg/m.  Physical Exam Vitals and nursing note reviewed.  Constitutional:      General: She is not in acute distress.    Appearance: She is well-developed. She is not ill-appearing.  HENT:     Head: Normocephalic and atraumatic.     Nose: Congestion and rhinorrhea present.     Right Turbinates: Enlarged.     Left Turbinates: Enlarged.     Right Sinus: No maxillary sinus tenderness or frontal sinus  tenderness.     Left Sinus: No maxillary sinus tenderness or frontal sinus tenderness.  Eyes:     Conjunctiva/sclera: Conjunctivae normal.  Cardiovascular:     Rate and Rhythm: Normal rate and regular rhythm.     Heart sounds: No murmur heard. Pulmonary:     Effort: Pulmonary effort is normal. No respiratory distress.     Breath sounds: Normal breath sounds. No stridor.  Lymphadenopathy:     Cervical: No cervical adenopathy.  Skin:    General: Skin is warm.     Findings: No erythema or rash.  Neurological:     General: No focal deficit present.     Mental Status: She is alert  and oriented to person, place, and time.     Gait: Gait normal.  Psychiatric:        Mood and Affect: Mood and affect normal.    ASSESSMENT AND PLAN:  Ms. Sonya Gilbert was seen today for cough.  Diagnoses and all orders for this visit:  URI, acute Symptoms suggests a viral etiology, I explained patient that symptomatic treatment is usually recommended in this case, so I do not think abx is needed at this time. Instructed to monitor for signs of complications, including new onset of fever among some, clearly instructed about warning signs. If symptoms are not greatly improved in 3 to 4 days, she can start taking doxycycline  100 mg twice daily x 7 days. I do not think imaging is needed at this time. I also explained that cough and nasal congestion can last a few days and sometimes weeks. F/U as needed.  -     benzonatate  (TESSALON ) 100 MG capsule; Take 1 capsule (100 mg total) by mouth 2 (two) times daily as needed for up to 10 days.  Asthma in adult, mild intermittent, uncomplicated Lung auscultation negative today. She is reporting intermittent wheezing at home. She has tolerated prednisone  well in the past, so recommend 40 mg daily for 3 to 5 days, instructed to take it with breakfast. Albuterol  2 puff every 6 hours for 1 week and then as needed. Continue Advair 250-50 mcg 1 puff twice daily, rinse  after use. Instructed about warning signs. -     predniSONE  (DELTASONE ) 20 MG tablet; Take 2 tablets (40 mg total) by mouth daily with breakfast for 5 days. -     albuterol  (VENTOLIN  HFA) 108 (90 Base) MCG/ACT inhaler; Inhale 2 puffs into the lungs every 6 (six) hours as needed for wheezing or shortness of breath.  Other orders -     doxycycline  (VIBRA -TABS) 100 MG tablet; Take 1 tablet (100 mg total) by mouth 2 (two) times daily for 7 days.  Return if symptoms worsen or fail to improve.  Yamir Carignan G. Jailee Jaquez, MD  [facility]. [place] office.      [1] Current Outpatient Medications on File Prior to Visit  Medication Sig Dispense Refill   Cholecalciferol 100 MCG (4000 UT) CAPS Take 2,000 Units by mouth daily.     [facility] OLOPATADINE  HCL 0.2 % SOLN APPLY 1 DROP TO EYE DAILY AS NEEDED. 2.5 mL 0   fluticasone  (FLONASE ) 50 MCG/ACT nasal spray Place 1 spray into both nostrils at bedtime. 18.2 mL 2   fluticasone -salmeterol (ADVAIR DISKUS) 250-50 MCG/ACT AEPB Inhale 1 puff into the lungs in the morning and at bedtime. 60 each 6   loratadine (CLARITIN) 10 MG tablet Take 10 mg by mouth daily.     olmesartan -hydrochlorothiazide (BENICAR  HCT) 20-12.5 MG tablet Take 1 tablet by mouth daily. 90 tablet 2   sodium chloride  (OCEAN) 0.65 % SOLN nasal spray Place 2 sprays into both nostrils in the morning and at bedtime.  0   No current facility-administered medications on file prior to visit.  [2] Allergies Allergen Reactions   Codeine Palpitations   Erythromycin Hives and Nausea And Vomiting   Other     Bees, mold, grass, cats   "

## 2024-07-24 NOTE — Patient Instructions (Addendum)
 A few things to remember from today's visit:  URI, acute  Asthma in adult, mild intermittent, uncomplicated - Plan: predniSONE  (DELTASONE ) 20 MG tablet, albuterol  (VENTOLIN  HFA) 108 (90 Base) MCG/ACT inhaler viral infections are self-limited and we treat each symptom depending of severity.  Over the counter medications as decongestants and cold medications usually help, they need to be taken with caution if there is a history of high blood pressure or palpitations. Tylenol and/or Ibuprofen also helps with most symptoms (headache, muscle aching, fever,etc) Plenty of fluids. Honey helps with cough. Steam inhalations helps with runny nose, nasal congestion, and may prevent sinus infections. Cough and nasal congestion could last a few days and sometimes weeks.  Take prednisone  with breakfast. Albuterol  inh 2 puff every 6 hours for a week then as needed for wheezing or shortness of breath.  If not greatly improved in 3 to 4 days you can start taking antibiotic, I do not think you need it at this time. Monitor for fever. Over-the-counter plain Mucinex may help.   If you need refills for medications you take chronically, please call your pharmacy. Do not use My Chart to request refills or for acute issues that need immediate attention. If you send a my chart message, it may take a few days to be addressed, specially if I am not in the office.  Please be sure medication list is accurate. If a new problem present, please set up appointment sooner than planned today.
